# Patient Record
Sex: Female | Born: 1955
Health system: Southern US, Community
[De-identification: ages and names within clinical notes are randomized; demographics above are authoritative.]

## PROBLEM LIST (undated history)

## (undated) DIAGNOSIS — M1990 Unspecified osteoarthritis, unspecified site: Secondary | ICD-10-CM

## (undated) DIAGNOSIS — I10 Essential (primary) hypertension: Secondary | ICD-10-CM

## (undated) DIAGNOSIS — E785 Hyperlipidemia, unspecified: Secondary | ICD-10-CM

## (undated) HISTORY — PX: COLONOSCOPY: SHX174

---

## 2004-04-15 ENCOUNTER — Ambulatory Visit: Payer: Self-pay | Admitting: Unknown Physician Specialty

## 2005-12-14 ENCOUNTER — Ambulatory Visit: Payer: Self-pay | Admitting: Internal Medicine

## 2007-06-20 ENCOUNTER — Ambulatory Visit: Payer: Self-pay | Admitting: Family Medicine

## 2008-06-22 ENCOUNTER — Ambulatory Visit: Payer: Self-pay | Admitting: Internal Medicine

## 2008-11-15 ENCOUNTER — Ambulatory Visit: Payer: Self-pay

## 2009-06-24 ENCOUNTER — Ambulatory Visit: Payer: Self-pay | Admitting: Internal Medicine

## 2010-11-13 ENCOUNTER — Emergency Department: Payer: Self-pay | Admitting: Emergency Medicine

## 2011-09-06 ENCOUNTER — Ambulatory Visit: Payer: Self-pay | Admitting: Internal Medicine

## 2011-11-21 ENCOUNTER — Ambulatory Visit: Payer: Self-pay | Admitting: Unknown Physician Specialty

## 2011-11-22 LAB — PATHOLOGY REPORT

## 2012-07-04 ENCOUNTER — Ambulatory Visit: Payer: Self-pay | Admitting: Internal Medicine

## 2012-07-04 LAB — RAPID STREP-A WITH REFLX: Micro Text Report: NEGATIVE

## 2012-09-06 ENCOUNTER — Ambulatory Visit: Payer: Self-pay | Admitting: Internal Medicine

## 2013-09-09 ENCOUNTER — Ambulatory Visit: Payer: Self-pay | Admitting: Internal Medicine

## 2013-12-10 DIAGNOSIS — I10 Essential (primary) hypertension: Secondary | ICD-10-CM | POA: Insufficient documentation

## 2013-12-10 DIAGNOSIS — J45909 Unspecified asthma, uncomplicated: Secondary | ICD-10-CM | POA: Insufficient documentation

## 2013-12-10 DIAGNOSIS — N951 Menopausal and female climacteric states: Secondary | ICD-10-CM | POA: Insufficient documentation

## 2014-12-17 DIAGNOSIS — R42 Dizziness and giddiness: Secondary | ICD-10-CM | POA: Insufficient documentation

## 2014-12-24 DIAGNOSIS — Z86010 Personal history of colonic polyps: Secondary | ICD-10-CM | POA: Insufficient documentation

## 2015-08-06 ENCOUNTER — Other Ambulatory Visit: Payer: Self-pay | Admitting: Internal Medicine

## 2015-08-06 DIAGNOSIS — Z1231 Encounter for screening mammogram for malignant neoplasm of breast: Secondary | ICD-10-CM

## 2016-04-13 DIAGNOSIS — J4521 Mild intermittent asthma with (acute) exacerbation: Secondary | ICD-10-CM | POA: Diagnosis not present

## 2016-04-13 DIAGNOSIS — J40 Bronchitis, not specified as acute or chronic: Secondary | ICD-10-CM | POA: Diagnosis not present

## 2016-04-13 DIAGNOSIS — I10 Essential (primary) hypertension: Secondary | ICD-10-CM | POA: Diagnosis not present

## 2016-05-26 DIAGNOSIS — M7711 Lateral epicondylitis, right elbow: Secondary | ICD-10-CM | POA: Diagnosis not present

## 2016-05-26 DIAGNOSIS — I10 Essential (primary) hypertension: Secondary | ICD-10-CM | POA: Diagnosis not present

## 2016-06-22 DIAGNOSIS — E782 Mixed hyperlipidemia: Secondary | ICD-10-CM | POA: Diagnosis not present

## 2016-06-22 DIAGNOSIS — Z1159 Encounter for screening for other viral diseases: Secondary | ICD-10-CM | POA: Diagnosis not present

## 2016-08-14 ENCOUNTER — Ambulatory Visit
Admission: EM | Admit: 2016-08-14 | Discharge: 2016-08-14 | Disposition: A | Discharge status: Home / Self Care | Payer: 59 | Attending: Family Medicine | Admitting: Family Medicine

## 2016-08-14 DIAGNOSIS — S83411A Sprain of medial collateral ligament of right knee, initial encounter: Secondary | ICD-10-CM

## 2016-08-14 DIAGNOSIS — S83419A Sprain of medial collateral ligament of unspecified knee, initial encounter: Secondary | ICD-10-CM | POA: Diagnosis not present

## 2016-08-14 HISTORY — DX: Essential (primary) hypertension: I10

## 2016-08-14 HISTORY — DX: Hyperlipidemia, unspecified: E78.5

## 2016-08-14 MED ORDER — HYDROCODONE-ACETAMINOPHEN 5-325 MG PO TABS: ORAL_TABLET | ORAL | 0 refills | Status: DC

## 2016-08-14 NOTE — ED Triage Notes (Signed)
Patient complains of right knee pain. Patient states that this started around 4 days ago. Patient states that pain is toward the left side of her knee. Patient states that she just returned from [place] yesterday and did a lot of walking while she was there. Patient states that she has been icing knee. Patient reports that she has not noticed any swelling.

## 2016-08-14 NOTE — ED Provider Notes (Signed)
[facility]    CSN: [id number] Arrival date & time: 08/14/16  1031     History   Chief Complaint Chief Complaint  Patient presents with  . Knee Pain    Right    HPI Alejandra Castro is a 61 y.o. female.   The history is provided by the patient.  Knee Pain  Location:  Knee Time since incident:  4 days Injury: no   Knee location:  R knee Pain details:    Quality:  Aching Chronicity:  New Dislocation: no   Foreign body present:  No foreign bodies Prior injury to area:  No Ineffective treatments:  NSAIDs (400mg  of ibuprofen) Associated symptoms: no back pain, no decreased ROM, no fatigue, no fever, no itching, no muscle weakness, no neck pain, no numbness, no stiffness, no swelling and no tingling   Risk factors: no frequent fractures, no known bone disorder and no recent illness     Past Medical History:  Diagnosis Date  . Hyperlipidemia   . Hypertension     There are no active problems to display for this patient.   Past Surgical History:  Procedure Laterality Date  . NO PAST SURGERIES      OB History    No data available       Home Medications    Prior to Admission medications   Medication Sig Start Date End Date Taking? Authorizing Provider  aspirin EC 81 MG tablet Take 81 mg by mouth daily.   Yes [provider]  felodipine (PLENDIL) 5 MG 24 hr tablet Take 5 mg by mouth daily.   Yes [provider]  FLUoxetine (PROZAC) 20 MG tablet Take 20 mg by mouth daily.   Yes [provider]  metoprolol succinate (TOPROL-XL) 50 MG 24 hr tablet Take 50 mg by mouth daily. Take with or immediately following a meal.   Yes [provider]  simvastatin (ZOCOR) 20 MG tablet Take 20 mg by mouth daily.   Yes [provider]  HYDROcodone-acetaminophen (NORCO/VICODIN) 5-325 MG tablet 1-2 tabs po bid prn 08/14/16   Norval Gable, MD    Family History History reviewed. No pertinent family history.  Social  History Social History  Substance Use Topics  . Smoking status: Never Smoker  . Smokeless tobacco: Never Used  . Alcohol use Yes     Comment: beer occasionally     Allergies   Patient has no known allergies.   Review of Systems Review of Systems  Constitutional: Negative for fatigue and fever.  Musculoskeletal: Negative for back pain, neck pain and stiffness.  Skin: Negative for itching.     Physical Exam Triage Vital Signs ED Triage Vitals  Enc Vitals Group     BP 08/14/16 1131 (!) 150/91     Pulse Rate 08/14/16 1131 63     Resp 08/14/16 1131 18     Temp 08/14/16 1131 98.7 F (37.1 C)     Temp Source 08/14/16 1131 Oral     SpO2 08/14/16 1131 96 %     Weight 08/14/16 1128 200 lb (90.7 kg)     Height 08/14/16 1128 5\' 6"  (1.676 m)     Head Circumference --      Peak Flow --      Pain Score 08/14/16 1128 10     Pain Loc --      Pain Edu? --      Excl. in [place]? --    No data found.  Updated Vital Signs BP (!) 150/91 (BP Location: Left Arm)   Pulse 63   Temp 98.7 F (37.1 C) (Oral)   Resp 18   Ht 5\' 6"  (1.676 m)   Wt 200 lb (90.7 kg)   SpO2 96%   BMI 32.28 kg/m   Visual Acuity Right Eye Distance:   Left Eye Distance:   Bilateral Distance:    Right Eye Near:   Left Eye Near:    Bilateral Near:     Physical Exam  Constitutional: She appears well-developed and well-nourished. No distress.  Musculoskeletal:       Right knee: She exhibits swelling (mild/minimal). She exhibits normal range of motion, no effusion, no ecchymosis, no deformity, no laceration, no erythema, normal alignment, no LCL laxity, normal patellar mobility, no bony tenderness, normal meniscus and no MCL laxity. Tenderness found. MCL tenderness noted. No medial joint line, no lateral joint line, no LCL and no patellar tendon tenderness noted.  Skin: She is not diaphoretic.  Nursing note and vitals reviewed.    UC Treatments / Results  Labs (all labs ordered are listed, but only  abnormal results are displayed) Labs Reviewed - No data to display  EKG  EKG Interpretation None       Radiology No results found.  Procedures Procedures (including critical care time)  Medications Ordered in UC Medications - No data to display   Initial Impression / Assessment and Plan / UC Course  I have reviewed the triage vital signs and the nursing notes.  Pertinent labs & imaging results that were available during my care of the patient were reviewed by me and considered in my medical decision making (see chart for details).       Final Clinical Impressions(s) / UC Diagnoses   Final diagnoses:  Sprain of medial collateral ligament of right knee, initial encounter    New Prescriptions Discharge Medication List as of 08/14/2016  1:10 PM    START taking these medications   Details  HYDROcodone-acetaminophen (NORCO/VICODIN) 5-325 MG tablet 1-2 tabs po bid prn, Print       1. diagnosis reviewed with patient 2. rx as per orders above; reviewed possible side effects, interactions, risks and benefits  3. Recommend supportive treatment with rest, ice, compression, elevation 4. Follow-up prn if symptoms worsen or don't improve   Norval Gable, MD 08/14/16 1425

## 2016-08-21 DIAGNOSIS — M25561 Pain in right knee: Secondary | ICD-10-CM | POA: Diagnosis not present

## 2016-09-06 DIAGNOSIS — M25561 Pain in right knee: Secondary | ICD-10-CM | POA: Diagnosis not present

## 2016-09-07 ENCOUNTER — Other Ambulatory Visit: Payer: Self-pay | Admitting: Unknown Physician Specialty

## 2016-09-07 DIAGNOSIS — M25561 Pain in right knee: Secondary | ICD-10-CM

## 2016-09-19 ENCOUNTER — Ambulatory Visit
Admission: RE | Admit: 2016-09-19 | Discharge: 2016-09-19 | Disposition: A | Discharge status: Home / Self Care | Payer: 59 | Source: Ambulatory Visit | Attending: Unknown Physician Specialty | Admitting: Unknown Physician Specialty

## 2016-09-19 DIAGNOSIS — M25561 Pain in right knee: Secondary | ICD-10-CM | POA: Diagnosis not present

## 2016-09-19 DIAGNOSIS — M25461 Effusion, right knee: Secondary | ICD-10-CM | POA: Diagnosis not present

## 2016-09-19 DIAGNOSIS — S83231A Complex tear of medial meniscus, current injury, right knee, initial encounter: Secondary | ICD-10-CM | POA: Insufficient documentation

## 2016-09-19 DIAGNOSIS — M949 Disorder of cartilage, unspecified: Secondary | ICD-10-CM | POA: Insufficient documentation

## 2016-09-19 DIAGNOSIS — X58XXXA Exposure to other specified factors, initial encounter: Secondary | ICD-10-CM | POA: Diagnosis not present

## 2016-09-27 DIAGNOSIS — S83221D Peripheral tear of medial meniscus, current injury, right knee, subsequent encounter: Secondary | ICD-10-CM | POA: Diagnosis not present

## 2016-10-02 ENCOUNTER — Encounter: Payer: Self-pay | Admitting: Emergency Medicine

## 2016-10-02 ENCOUNTER — Emergency Department: Payer: 59

## 2016-10-02 ENCOUNTER — Emergency Department
Admission: EM | Admit: 2016-10-02 | Discharge: 2016-10-02 | Disposition: A | Discharge status: Home / Self Care | Payer: 59 | Attending: Emergency Medicine | Admitting: Emergency Medicine

## 2016-10-02 DIAGNOSIS — Y92099 Unspecified place in other non-institutional residence as the place of occurrence of the external cause: Secondary | ICD-10-CM | POA: Insufficient documentation

## 2016-10-02 DIAGNOSIS — W19XXXA Unspecified fall, initial encounter: Secondary | ICD-10-CM

## 2016-10-02 DIAGNOSIS — Y998 Other external cause status: Secondary | ICD-10-CM | POA: Diagnosis not present

## 2016-10-02 DIAGNOSIS — S42352A Displaced comminuted fracture of shaft of humerus, left arm, initial encounter for closed fracture: Secondary | ICD-10-CM | POA: Diagnosis not present

## 2016-10-02 DIAGNOSIS — W01198A Fall on same level from slipping, tripping and stumbling with subsequent striking against other object, initial encounter: Secondary | ICD-10-CM | POA: Diagnosis not present

## 2016-10-02 DIAGNOSIS — I10 Essential (primary) hypertension: Secondary | ICD-10-CM | POA: Diagnosis not present

## 2016-10-02 DIAGNOSIS — Y939 Activity, unspecified: Secondary | ICD-10-CM | POA: Diagnosis not present

## 2016-10-02 DIAGNOSIS — S4992XA Unspecified injury of left shoulder and upper arm, initial encounter: Secondary | ICD-10-CM | POA: Diagnosis not present

## 2016-10-02 DIAGNOSIS — S42202A Unspecified fracture of upper end of left humerus, initial encounter for closed fracture: Secondary | ICD-10-CM | POA: Diagnosis not present

## 2016-10-02 MED ORDER — ONDANSETRON HCL 4 MG/2ML IJ SOLN
4.0000 mg | Freq: Once | INTRAMUSCULAR | Status: AC
  Administered 2016-10-02: 4 mg via INTRAVENOUS
  Filled 2016-10-02: qty 2

## 2016-10-02 MED ORDER — HYDROMORPHONE HCL 1 MG/ML IJ SOLN
1.0000 mg | Freq: Once | INTRAMUSCULAR | Status: AC
  Administered 2016-10-02: 1 mg via INTRAVENOUS
  Filled 2016-10-02: qty 1

## 2016-10-02 MED ORDER — MORPHINE SULFATE (PF) 4 MG/ML IV SOLN
INTRAVENOUS | Status: AC
  Administered 2016-10-02: 4 mg
  Filled 2016-10-02: qty 1

## 2016-10-02 MED ORDER — SODIUM CHLORIDE 0.9 % IV BOLUS (SEPSIS)
1000.0000 mL | Freq: Once | INTRAVENOUS | Status: AC
  Administered 2016-10-02: 1000 mL via INTRAVENOUS

## 2016-10-02 MED ORDER — ONDANSETRON HCL 4 MG/2ML IJ SOLN
4.0000 mg | Freq: Once | INTRAMUSCULAR | Status: AC
  Administered 2016-10-02: 4 mg via INTRAVENOUS

## 2016-10-02 MED ORDER — FENTANYL CITRATE (PF) 100 MCG/2ML IJ SOLN
50.0000 ug | INTRAMUSCULAR | Status: DC | PRN
  Administered 2016-10-02: 50 ug via INTRAVENOUS

## 2016-10-02 MED ORDER — FENTANYL CITRATE (PF) 100 MCG/2ML IJ SOLN
INTRAMUSCULAR | Status: AC
  Administered 2016-10-02: 50 ug via INTRAVENOUS
  Filled 2016-10-02: qty 2

## 2016-10-02 MED ORDER — IBUPROFEN 800 MG PO TABS: 800.0000 mg | ORAL_TABLET | Freq: Three times a day (TID) | ORAL | 0 refills | Status: DC | PRN

## 2016-10-02 MED ORDER — OXYCODONE-ACETAMINOPHEN 5-325 MG PO TABS: 1.0000 | ORAL_TABLET | ORAL | 0 refills | Status: AC | PRN

## 2016-10-02 MED ORDER — ONDANSETRON HCL 4 MG/2ML IJ SOLN
INTRAMUSCULAR | Status: AC
  Administered 2016-10-02: 4 mg via INTRAVENOUS
  Filled 2016-10-02: qty 2

## 2016-10-02 NOTE — ED Triage Notes (Signed)
Tripped and fell over dog just prior to arrival, severe pain L upper arm just above elbow. Patient hyperventilating and dizzy on arrival to triage. Able to slow breathing with resolution of symptoms.

## 2016-10-02 NOTE — ED Notes (Signed)
Splint applied to left upper arm.  Pt tolerated well.

## 2016-10-02 NOTE — ED Notes (Signed)
Iv meds given.  

## 2016-10-02 NOTE — ED Notes (Signed)
Resumed care from [facility].  Pt has left arm pain.  Iv in place  Dr Mariea Clonts at bedside.

## 2016-10-02 NOTE — ED Provider Notes (Signed)
[facility] Provider Note  ____________________________________________  Time seen: Approximately 3:12 PM  I have reviewed the triage vital signs and the nursing notes.   HISTORY  Chief Complaint Arm Pain    HPI Alejandra Castro is a 61 y.o. female , right-handed, presenting with left upper arm pain after fall. The patient reports that prior to arrival she tripped over her dog and fell into a doorway frame, striking her left arm and falling down. She did not hit her head or lose consciousness. She was able to stand up and ambulate. She denies any neck, back or any other extremity pain. She denies any numbness or tingling. She reports that she did not initially have significant pain, until she was laid flat for x-ray and was unable to tolerate any imaging. She then developed severe pain with associated nausea, the nausea has improved with Zofran. No headache or vomiting.    Past Medical History:  Diagnosis Date  . Hyperlipidemia   . Hypertension     There are no active problems to display for this patient.   Past Surgical History:  Procedure Laterality Date  . NO PAST SURGERIES      Current Outpatient Rx  . Order #: [id number] Class: Historical Med  . Order #: [id number] Class: Historical Med  . Order #: [id number] Class: Historical Med  . Order #: [id number] Class: Print  . Order #: [id number] Class: Historical Med  . Order #: [id number] Class: Historical Med    Allergies Patient has no known allergies.  No family history on file.  Social History Social History  Substance Use Topics  . Smoking status: Never Smoker  . Smokeless tobacco: Never Used  . Alcohol use Yes     Comment: beer occasionally    Review of Systems Constitutional: No fever/chills.No lightheadedness or syncope. Positive fall. No loss of consciousness. Eyes: No visual changes. No blurred or double vision. ENT: No congestion or rhinorrhea. Cardiovascular:  Denies chest pain. Denies palpitations. Respiratory: Denies shortness of breath.  No cough. Gastrointestinal: No abdominal pain.  Positive nausea, no vomiting.  No diarrhea.  No constipation. Genitourinary: Negative for dysuria. Musculoskeletal: Negative for neck or back pain. Able to ambulate without pain in the legs. Positive pain in the mid section of the left upper arm. Skin: Negative for rash. Neurological: Negative for headaches. No focal numbness, tingling or weakness.     ____________________________________________   PHYSICAL EXAM:  VITAL SIGNS: ED Triage Vitals  Enc Vitals Group     BP 10/02/16 1409 98/68     Pulse Rate 10/02/16 1409 65     Resp 10/02/16 1409 18     Temp 10/02/16 1409 98 F (36.7 C)     Temp Source 10/02/16 1409 Oral     SpO2 10/02/16 1409 99 %     Weight 10/02/16 1410 210 lb (95.3 kg)     Height 10/02/16 1410 5\' 6"  (1.676 m)     Head Circumference --      Peak Flow --      Pain Score 10/02/16 1409 10     Pain Loc --      Pain Edu? --      Excl. in [place]? --     Constitutional: Alert and oriented. Mildly uncomfortable appearing but in no acute distress. Answers questions appropriately. GCS is 15. Eyes: Conjunctivae are normal.  EOMI. No scleral icterus. No raccoon eyes. Head: Atraumatic. No Battle sign. Nose: No congestion/rhinnorhea. No swelling over the nose. No septal hematoma. Mouth/Throat:  Mucous membranes are moist. No malocclusion or dental injury. Neck: No stridor.  Supple.  No midline C-spine tenderness to palpation, step-offs or deformities. Full range of motion without pain. Cardiovascular: Normal rate, regular rhythm. No murmurs, rubs or gallops.  Respiratory: Normal respiratory effort.  No accessory muscle use or retractions. Lungs CTAB.  No wheezes, rales or ronchi. Gastrointestinal: Soft, nontender and nondistended.  No guarding or rebound.  No peritoneal signs. Musculoskeletal: Pelvis is stable. Full range of motion of the bilateral  ankles, knees, hips, wrists, right elbow and right shoulder without pain. The patient has significant with palpation over the proximal third of the humerus and severe pain with any movement of the elbow or shoulder on the left side. She has normal left radial pulse. She has 5 out of 5 grip strength on the left. She has normal sensation to light touch in the left upper extremity. Her skin examination is normal and she has no abrasions or lacerations, ecchymosis in the left upper extremity. Neurologic:  A&Ox3.  Speech is clear.  Face and smile are symmetric.  EOMI.  Moves all extremities well. Skin:  Skin is warm, dry and intact. No rash noted. Psychiatric: Mood and affect are normal. Speech and behavior are normal.  Normal judgement.  ____________________________________________   LABS (all labs ordered are listed, but only abnormal results are displayed)  Labs Reviewed - No data to display ____________________________________________  EKG  Not indicated ____________________________________________  RADIOLOGY  Dg Humerus Left  Result Date: 10/02/2016 CLINICAL DATA:  61 year old who tripped over her dog and fell, landing on her left arm. Severe pain. Initial encounter. EXAM: LEFT HUMERUS - 2+ VIEW COMPARISON:  None. FINDINGS: Acute comminuted 3 part fracture involving the proximal humeral metaphysis and diaphysis. Visualized shoulder joint and elbow joint anatomically aligned and intact. Bone mineral density well preserved with normal humeral cortical thickness. IMPRESSION: Acute comminuted 3 part fracture involving the proximal humerus. (As bone mineral density appears well-preserved, an underlying pathologic process may account for the fracture, given the fact that the fall was from a standing height). Electronically Signed   By: Evangeline Dakin M.D.   On: 10/02/2016 15:44    ____________________________________________   PROCEDURES  Procedure(s) performed:  None  Procedures  Critical Care performed: No ____________________________________________   INITIAL IMPRESSION / ASSESSMENT AND PLAN / ED COURSE  Pertinent labs & imaging results that were available during my care of the patient were reviewed by me and considered in my medical decision making (see chart for details).  61 y.o. right-handed female presenting with left upper arm pain after fall. The patient did not lose consciousness and has no evidence that would be consistent with an acute head injury, or any spine injury. There are no other obvious extremity injuries. We'll get a shoulder x-ray, and continue to manage the patient's pain.  Plan reevaluation after imaging.  ----------------------------------------- 4:04 PM on 10/02/2016 -----------------------------------------  The patient does have an acute comminuted 3 part fracture in the proximal humeral metaphysis and diaphysis. I have spoken with Dr. Roland Rack, the orthopedist on-call, who recommends a coaptation splint. Depending on how the bone begins to heal, the patient will be reevaluated in 1 week in his clinic with repeat imaging. Surgical versus nonsurgical treatment will be decided based on the repeat imaging. The patient understands the plan, will place her in a splint and ensure that she has plenty of pain control for home, and I anticipate discharge. She return precautions as well as follow-up instructions were given.  SPLINT APPLICATION Date/Time: 3:22 PM Authorized by: Eula Listen Consent: Verbal consent obtained. Risks and benefits: risks, benefits and alternatives were discussed Consent given by: patient Splint applied by: ER technician Location details: left upper arm Splint type: coaptation splint   Supplies used: Orthoglass Post-procedure: The splinted body part was neurovascularly unchanged following the procedure. Patient tolerance: Patient tolerated the procedure well with no immediate complications.  I re-evaluated the splint and the pt was neurovascularly intact.     ____________________________________________  FINAL CLINICAL IMPRESSION(S) / ED DIAGNOSES  Final diagnoses:  Closed displaced comminuted fracture of shaft of left humerus, initial encounter  Fall, initial encounter         NEW MEDICATIONS STARTED DURING THIS VISIT:  New Prescriptions   No medications on file      Eula Listen, MD 10/02/16 1609

## 2016-10-02 NOTE — ED Notes (Signed)
D/c inst to pt and son.

## 2016-10-02 NOTE — Discharge Instructions (Signed)
You may take Tylenol or Motrin for mild to moderate pain. Take Percocet for severe pain. Do not drive until you're cleared by the orthopedist to do so, as she will be unable to hold the steering well. Keep your splint on at all times, until the orthopedist says that you may remove it.  You will be seen by [place], Dr. Nicholaus Bloom PA, on Monday, July 2 at 10:30 AM. They will repeat an x-ray, and determine whether you can continue conservative nonsurgical management or whether you will require surgery.  Return to the emergency department if you develop severe pain, numbness tingling or weakness, changes in your skin color, fever, or any other symptoms concerning to you.

## 2016-10-09 DIAGNOSIS — S42352A Displaced comminuted fracture of shaft of humerus, left arm, initial encounter for closed fracture: Secondary | ICD-10-CM | POA: Diagnosis not present

## 2016-10-12 DIAGNOSIS — S42302B Unspecified fracture of shaft of humerus, left arm, initial encounter for open fracture: Secondary | ICD-10-CM | POA: Diagnosis not present

## 2016-10-13 ENCOUNTER — Encounter
Admission: RE | Admit: 2016-10-13 | Discharge: 2016-10-13 | Disposition: A | Discharge status: Home / Self Care | Payer: 59 | Source: Ambulatory Visit | Attending: Surgery | Admitting: Surgery

## 2016-10-13 HISTORY — DX: Unspecified osteoarthritis, unspecified site: M19.90

## 2016-10-13 NOTE — Patient Instructions (Addendum)
  Your procedure is scheduled on: 10-17-16 TUESDAY Report to Same Day Surgery 2nd floor medical mall [facility] Entrance-take elevator on left to 2nd floor.  Check in with surgery information desk.) To find out your arrival time please call [phone] between 1PM - 3PM on 10-16-16  Remember: Instructions that are not followed completely may result in serious medical risk, up to and including death, or upon the discretion of your surgeon and anesthesiologist your surgery may need to be rescheduled.    _x___ 1. Do not eat food or drink liquids after midnight. No gum chewing or hard candies.     __x__ 2. No Alcohol for 24 hours before or after surgery.   __x__3. No Smoking for 24 prior to surgery.   ____  4. Bring all medications with you on the day of surgery if instructed.    __x__ 5. Notify your doctor if there is any change in your medical condition     (cold, fever, infections).     Do not wear jewelry, make-up, hairpins, clips or nail polish.  Do not wear lotions, powders, or perfumes. You may wear deodorant.  Do not shave 48 hours prior to surgery. Men may shave face and neck.  Do not bring valuables to the hospital.    [facility] is not responsible for any belongings or valuables.               Contacts, dentures or bridgework may not be worn into surgery.  Leave your suitcase in the car. After surgery it may be brought to your room.  For patients admitted to the hospital, discharge time is determined by your treatment team.   Patients discharged the day of surgery will not be allowed to drive home.  You will need someone to drive you home and stay with you the night of your procedure.    Please read over the following fact sheets that you were given:   [facility] Preparing for Surgery and or MRSA Information   _x___ TAKE THE FOLLOWING MEDICATIONS THE MORNING OF SURGERY WITH A SMALL SIP OF WATER. These include:  1. PROZAC (FLUOXETINE)  2.  3.  4.  5.  6.  ____Fleets  enema or Magnesium Citrate as directed.   _x___ Use CHG Soap or sage wipes as directed on instruction sheet   ____ Use inhalers on the day of surgery and bring to hospital day of surgery  ____ Stop Metformin and Janumet 2 days prior to surgery.    ____ Take 1/2 of usual insulin dose the night before surgery and none on the morning  surgery.   _x___ Follow recommendations from Cardiologist, Pulmonologist or PCP regarding stopping Aspirin, Coumadin, Pllavix ,Eliquis, Effient, or Pradaxa, and Pletal-STOP 81 MG ASPIRIN NOW  X____Stop Anti-inflammatories such as Advil, Aleve, Ibuprofen, Motrin, Naproxen, Naprosyn, Goodies powders or aspirin products. OK to take Tylenol AND OXYCODONE   _x___ Stop supplements until after surgery-STOP FISH OIL NOW-MAY RESUME AFTER SURGERY   ____ Bring C-Pap to the hospital.

## 2016-10-16 ENCOUNTER — Inpatient Hospital Stay: Admission: RE | Admit: 2016-10-16 | Payer: 59 | Source: Ambulatory Visit

## 2016-10-16 ENCOUNTER — Encounter: Payer: Self-pay | Admitting: Anesthesiology

## 2016-10-16 DIAGNOSIS — S42352A Displaced comminuted fracture of shaft of humerus, left arm, initial encounter for closed fracture: Secondary | ICD-10-CM | POA: Diagnosis not present

## 2016-10-16 DIAGNOSIS — S42352D Displaced comminuted fracture of shaft of humerus, left arm, subsequent encounter for fracture with routine healing: Secondary | ICD-10-CM | POA: Diagnosis not present

## 2016-10-17 ENCOUNTER — Ambulatory Visit: Admission: RE | Admit: 2016-10-17 | Payer: 59 | Source: Ambulatory Visit | Admitting: Surgery

## 2016-10-17 ENCOUNTER — Encounter: Admission: RE | Payer: Self-pay | Source: Ambulatory Visit

## 2016-10-17 SURGERY — OPEN REDUCTION INTERNAL FIXATION (ORIF) HUMERAL SHAFT FRACTURE
Anesthesia: Choice | Laterality: Left

## 2016-11-14 DIAGNOSIS — S42352D Displaced comminuted fracture of shaft of humerus, left arm, subsequent encounter for fracture with routine healing: Secondary | ICD-10-CM | POA: Diagnosis not present

## 2016-12-13 DIAGNOSIS — S42352D Displaced comminuted fracture of shaft of humerus, left arm, subsequent encounter for fracture with routine healing: Secondary | ICD-10-CM | POA: Diagnosis not present

## 2016-12-13 DIAGNOSIS — M25512 Pain in left shoulder: Secondary | ICD-10-CM | POA: Diagnosis not present

## 2016-12-18 DIAGNOSIS — M7751 Other enthesopathy of right foot: Secondary | ICD-10-CM | POA: Diagnosis not present

## 2016-12-18 DIAGNOSIS — M79671 Pain in right foot: Secondary | ICD-10-CM | POA: Diagnosis not present

## 2016-12-20 DIAGNOSIS — M25512 Pain in left shoulder: Secondary | ICD-10-CM | POA: Diagnosis not present

## 2016-12-20 DIAGNOSIS — S42352D Displaced comminuted fracture of shaft of humerus, left arm, subsequent encounter for fracture with routine healing: Secondary | ICD-10-CM | POA: Diagnosis not present

## 2016-12-20 DIAGNOSIS — M25522 Pain in left elbow: Secondary | ICD-10-CM | POA: Diagnosis not present

## 2016-12-25 DIAGNOSIS — S42352D Displaced comminuted fracture of shaft of humerus, left arm, subsequent encounter for fracture with routine healing: Secondary | ICD-10-CM | POA: Diagnosis not present

## 2016-12-25 DIAGNOSIS — M25512 Pain in left shoulder: Secondary | ICD-10-CM | POA: Diagnosis not present

## 2016-12-25 DIAGNOSIS — M25522 Pain in left elbow: Secondary | ICD-10-CM | POA: Diagnosis not present

## 2016-12-26 ENCOUNTER — Other Ambulatory Visit: Payer: Self-pay | Admitting: Internal Medicine

## 2016-12-26 DIAGNOSIS — E782 Mixed hyperlipidemia: Secondary | ICD-10-CM | POA: Diagnosis not present

## 2016-12-26 DIAGNOSIS — Z1231 Encounter for screening mammogram for malignant neoplasm of breast: Secondary | ICD-10-CM

## 2016-12-26 DIAGNOSIS — I10 Essential (primary) hypertension: Secondary | ICD-10-CM | POA: Diagnosis not present

## 2016-12-26 DIAGNOSIS — Z23 Encounter for immunization: Secondary | ICD-10-CM | POA: Diagnosis not present

## 2016-12-26 DIAGNOSIS — R809 Proteinuria, unspecified: Secondary | ICD-10-CM | POA: Diagnosis not present

## 2016-12-27 DIAGNOSIS — M25512 Pain in left shoulder: Secondary | ICD-10-CM | POA: Diagnosis not present

## 2016-12-27 DIAGNOSIS — M25522 Pain in left elbow: Secondary | ICD-10-CM | POA: Diagnosis not present

## 2016-12-27 DIAGNOSIS — S42352D Displaced comminuted fracture of shaft of humerus, left arm, subsequent encounter for fracture with routine healing: Secondary | ICD-10-CM | POA: Diagnosis not present

## 2016-12-29 DIAGNOSIS — M25512 Pain in left shoulder: Secondary | ICD-10-CM | POA: Diagnosis not present

## 2016-12-29 DIAGNOSIS — S42352D Displaced comminuted fracture of shaft of humerus, left arm, subsequent encounter for fracture with routine healing: Secondary | ICD-10-CM | POA: Diagnosis not present

## 2016-12-29 DIAGNOSIS — M25522 Pain in left elbow: Secondary | ICD-10-CM | POA: Diagnosis not present

## 2017-01-01 DIAGNOSIS — M25522 Pain in left elbow: Secondary | ICD-10-CM | POA: Diagnosis not present

## 2017-01-01 DIAGNOSIS — S42352D Displaced comminuted fracture of shaft of humerus, left arm, subsequent encounter for fracture with routine healing: Secondary | ICD-10-CM | POA: Diagnosis not present

## 2017-01-01 DIAGNOSIS — M25512 Pain in left shoulder: Secondary | ICD-10-CM | POA: Diagnosis not present

## 2017-01-03 DIAGNOSIS — S42352D Displaced comminuted fracture of shaft of humerus, left arm, subsequent encounter for fracture with routine healing: Secondary | ICD-10-CM | POA: Diagnosis not present

## 2017-01-03 DIAGNOSIS — M25522 Pain in left elbow: Secondary | ICD-10-CM | POA: Diagnosis not present

## 2017-01-03 DIAGNOSIS — M25512 Pain in left shoulder: Secondary | ICD-10-CM | POA: Diagnosis not present

## 2017-01-05 DIAGNOSIS — M25512 Pain in left shoulder: Secondary | ICD-10-CM | POA: Diagnosis not present

## 2017-01-05 DIAGNOSIS — M25522 Pain in left elbow: Secondary | ICD-10-CM | POA: Diagnosis not present

## 2017-01-05 DIAGNOSIS — S42352D Displaced comminuted fracture of shaft of humerus, left arm, subsequent encounter for fracture with routine healing: Secondary | ICD-10-CM | POA: Diagnosis not present

## 2017-01-08 DIAGNOSIS — S42352D Displaced comminuted fracture of shaft of humerus, left arm, subsequent encounter for fracture with routine healing: Secondary | ICD-10-CM | POA: Diagnosis not present

## 2017-01-08 DIAGNOSIS — S42355G Nondisplaced comminuted fracture of shaft of humerus, left arm, subsequent encounter for fracture with delayed healing: Secondary | ICD-10-CM | POA: Diagnosis not present

## 2017-01-10 DIAGNOSIS — M25522 Pain in left elbow: Secondary | ICD-10-CM | POA: Diagnosis not present

## 2017-01-10 DIAGNOSIS — S42352D Displaced comminuted fracture of shaft of humerus, left arm, subsequent encounter for fracture with routine healing: Secondary | ICD-10-CM | POA: Diagnosis not present

## 2017-01-10 DIAGNOSIS — M25512 Pain in left shoulder: Secondary | ICD-10-CM | POA: Diagnosis not present

## 2017-01-12 DIAGNOSIS — M25522 Pain in left elbow: Secondary | ICD-10-CM | POA: Diagnosis not present

## 2017-01-12 DIAGNOSIS — M25512 Pain in left shoulder: Secondary | ICD-10-CM | POA: Diagnosis not present

## 2017-01-12 DIAGNOSIS — S42352D Displaced comminuted fracture of shaft of humerus, left arm, subsequent encounter for fracture with routine healing: Secondary | ICD-10-CM | POA: Diagnosis not present

## 2017-01-18 DIAGNOSIS — S42355G Nondisplaced comminuted fracture of shaft of humerus, left arm, subsequent encounter for fracture with delayed healing: Secondary | ICD-10-CM | POA: Diagnosis not present

## 2017-01-19 DIAGNOSIS — S42352D Displaced comminuted fracture of shaft of humerus, left arm, subsequent encounter for fracture with routine healing: Secondary | ICD-10-CM | POA: Diagnosis not present

## 2017-01-19 DIAGNOSIS — M25522 Pain in left elbow: Secondary | ICD-10-CM | POA: Diagnosis not present

## 2017-01-19 DIAGNOSIS — M25512 Pain in left shoulder: Secondary | ICD-10-CM | POA: Diagnosis not present

## 2017-01-22 DIAGNOSIS — M25522 Pain in left elbow: Secondary | ICD-10-CM | POA: Diagnosis not present

## 2017-01-22 DIAGNOSIS — M25512 Pain in left shoulder: Secondary | ICD-10-CM | POA: Diagnosis not present

## 2017-01-22 DIAGNOSIS — S42352D Displaced comminuted fracture of shaft of humerus, left arm, subsequent encounter for fracture with routine healing: Secondary | ICD-10-CM | POA: Diagnosis not present

## 2017-01-24 DIAGNOSIS — S42352D Displaced comminuted fracture of shaft of humerus, left arm, subsequent encounter for fracture with routine healing: Secondary | ICD-10-CM | POA: Diagnosis not present

## 2017-01-24 DIAGNOSIS — M25522 Pain in left elbow: Secondary | ICD-10-CM | POA: Diagnosis not present

## 2017-01-24 DIAGNOSIS — M25512 Pain in left shoulder: Secondary | ICD-10-CM | POA: Diagnosis not present

## 2017-01-29 DIAGNOSIS — M25512 Pain in left shoulder: Secondary | ICD-10-CM | POA: Diagnosis not present

## 2017-01-29 DIAGNOSIS — S42352D Displaced comminuted fracture of shaft of humerus, left arm, subsequent encounter for fracture with routine healing: Secondary | ICD-10-CM | POA: Diagnosis not present

## 2017-01-29 DIAGNOSIS — M25522 Pain in left elbow: Secondary | ICD-10-CM | POA: Diagnosis not present

## 2017-02-02 DIAGNOSIS — M25522 Pain in left elbow: Secondary | ICD-10-CM | POA: Diagnosis not present

## 2017-02-02 DIAGNOSIS — S42352D Displaced comminuted fracture of shaft of humerus, left arm, subsequent encounter for fracture with routine healing: Secondary | ICD-10-CM | POA: Diagnosis not present

## 2017-02-02 DIAGNOSIS — M25512 Pain in left shoulder: Secondary | ICD-10-CM | POA: Diagnosis not present

## 2017-02-05 DIAGNOSIS — F5101 Primary insomnia: Secondary | ICD-10-CM | POA: Diagnosis not present

## 2017-02-05 DIAGNOSIS — G478 Other sleep disorders: Secondary | ICD-10-CM | POA: Diagnosis not present

## 2017-02-05 DIAGNOSIS — R0683 Snoring: Secondary | ICD-10-CM | POA: Diagnosis not present

## 2017-02-19 DIAGNOSIS — S42355G Nondisplaced comminuted fracture of shaft of humerus, left arm, subsequent encounter for fracture with delayed healing: Secondary | ICD-10-CM | POA: Diagnosis not present

## 2017-02-23 DIAGNOSIS — G4733 Obstructive sleep apnea (adult) (pediatric): Secondary | ICD-10-CM | POA: Insufficient documentation

## 2017-03-12 DIAGNOSIS — G4733 Obstructive sleep apnea (adult) (pediatric): Secondary | ICD-10-CM | POA: Diagnosis not present

## 2017-03-21 DIAGNOSIS — S42355G Nondisplaced comminuted fracture of shaft of humerus, left arm, subsequent encounter for fracture with delayed healing: Secondary | ICD-10-CM | POA: Diagnosis not present

## 2017-04-12 DIAGNOSIS — G4733 Obstructive sleep apnea (adult) (pediatric): Secondary | ICD-10-CM | POA: Diagnosis not present

## 2017-04-16 DIAGNOSIS — M1712 Unilateral primary osteoarthritis, left knee: Secondary | ICD-10-CM | POA: Diagnosis not present

## 2017-05-03 DIAGNOSIS — Z86010 Personal history of colonic polyps: Secondary | ICD-10-CM | POA: Diagnosis not present

## 2017-05-07 DIAGNOSIS — I10 Essential (primary) hypertension: Secondary | ICD-10-CM | POA: Diagnosis not present

## 2017-05-07 DIAGNOSIS — F5101 Primary insomnia: Secondary | ICD-10-CM | POA: Diagnosis not present

## 2017-05-07 DIAGNOSIS — G4733 Obstructive sleep apnea (adult) (pediatric): Secondary | ICD-10-CM | POA: Diagnosis not present

## 2017-05-13 DIAGNOSIS — G4733 Obstructive sleep apnea (adult) (pediatric): Secondary | ICD-10-CM | POA: Diagnosis not present

## 2017-06-05 DIAGNOSIS — Z86010 Personal history of colonic polyps: Secondary | ICD-10-CM | POA: Diagnosis not present

## 2017-06-05 DIAGNOSIS — K648 Other hemorrhoids: Secondary | ICD-10-CM | POA: Diagnosis not present

## 2017-06-05 DIAGNOSIS — K5730 Diverticulosis of large intestine without perforation or abscess without bleeding: Secondary | ICD-10-CM | POA: Diagnosis not present

## 2017-06-05 DIAGNOSIS — D122 Benign neoplasm of ascending colon: Secondary | ICD-10-CM | POA: Diagnosis not present

## 2017-06-05 DIAGNOSIS — K621 Rectal polyp: Secondary | ICD-10-CM | POA: Diagnosis not present

## 2017-06-05 DIAGNOSIS — Z1211 Encounter for screening for malignant neoplasm of colon: Secondary | ICD-10-CM | POA: Diagnosis not present

## 2017-06-05 DIAGNOSIS — D126 Benign neoplasm of colon, unspecified: Secondary | ICD-10-CM | POA: Diagnosis not present

## 2017-06-05 DIAGNOSIS — K620 Anal polyp: Secondary | ICD-10-CM | POA: Diagnosis not present

## 2017-06-05 LAB — HM COLONOSCOPY

## 2017-06-10 DIAGNOSIS — G4733 Obstructive sleep apnea (adult) (pediatric): Secondary | ICD-10-CM | POA: Diagnosis not present

## 2017-06-25 DIAGNOSIS — G4733 Obstructive sleep apnea (adult) (pediatric): Secondary | ICD-10-CM | POA: Diagnosis not present

## 2017-06-27 DIAGNOSIS — I10 Essential (primary) hypertension: Secondary | ICD-10-CM | POA: Diagnosis not present

## 2017-06-27 DIAGNOSIS — Z23 Encounter for immunization: Secondary | ICD-10-CM | POA: Diagnosis not present

## 2017-06-27 DIAGNOSIS — E782 Mixed hyperlipidemia: Secondary | ICD-10-CM | POA: Diagnosis not present

## 2017-06-27 DIAGNOSIS — F419 Anxiety disorder, unspecified: Secondary | ICD-10-CM | POA: Insufficient documentation

## 2017-07-11 DIAGNOSIS — G4733 Obstructive sleep apnea (adult) (pediatric): Secondary | ICD-10-CM | POA: Diagnosis not present

## 2017-07-23 DIAGNOSIS — L718 Other rosacea: Secondary | ICD-10-CM | POA: Diagnosis not present

## 2017-07-23 DIAGNOSIS — L738 Other specified follicular disorders: Secondary | ICD-10-CM | POA: Diagnosis not present

## 2017-08-06 ENCOUNTER — Ambulatory Visit
Admission: RE | Admit: 2017-08-06 | Discharge: 2017-08-06 | Disposition: A | Discharge status: Home / Self Care | Payer: 59 | Source: Ambulatory Visit | Attending: Internal Medicine | Admitting: Internal Medicine

## 2017-08-06 ENCOUNTER — Encounter (INDEPENDENT_AMBULATORY_CARE_PROVIDER_SITE_OTHER): Payer: Self-pay

## 2017-08-06 DIAGNOSIS — Z1231 Encounter for screening mammogram for malignant neoplasm of breast: Secondary | ICD-10-CM

## 2017-08-07 ENCOUNTER — Other Ambulatory Visit: Payer: Self-pay | Admitting: Internal Medicine

## 2017-08-07 DIAGNOSIS — R921 Mammographic calcification found on diagnostic imaging of breast: Secondary | ICD-10-CM | POA: Insufficient documentation

## 2017-08-07 DIAGNOSIS — R928 Other abnormal and inconclusive findings on diagnostic imaging of breast: Secondary | ICD-10-CM

## 2017-08-10 DIAGNOSIS — G4733 Obstructive sleep apnea (adult) (pediatric): Secondary | ICD-10-CM | POA: Diagnosis not present

## 2017-08-15 DIAGNOSIS — D485 Neoplasm of uncertain behavior of skin: Secondary | ICD-10-CM | POA: Diagnosis not present

## 2017-08-15 DIAGNOSIS — B372 Candidiasis of skin and nail: Secondary | ICD-10-CM | POA: Diagnosis not present

## 2017-08-15 DIAGNOSIS — L578 Other skin changes due to chronic exposure to nonionizing radiation: Secondary | ICD-10-CM | POA: Diagnosis not present

## 2017-08-23 ENCOUNTER — Ambulatory Visit
Admission: RE | Admit: 2017-08-23 | Discharge: 2017-08-23 | Disposition: A | Discharge status: Home / Self Care | Payer: 59 | Source: Ambulatory Visit | Attending: Internal Medicine | Admitting: Internal Medicine

## 2017-08-23 DIAGNOSIS — R921 Mammographic calcification found on diagnostic imaging of breast: Secondary | ICD-10-CM

## 2017-08-23 DIAGNOSIS — R928 Other abnormal and inconclusive findings on diagnostic imaging of breast: Secondary | ICD-10-CM

## 2017-08-27 DIAGNOSIS — E782 Mixed hyperlipidemia: Secondary | ICD-10-CM | POA: Diagnosis not present

## 2017-08-27 DIAGNOSIS — G4733 Obstructive sleep apnea (adult) (pediatric): Secondary | ICD-10-CM | POA: Diagnosis not present

## 2017-08-27 DIAGNOSIS — I10 Essential (primary) hypertension: Secondary | ICD-10-CM | POA: Diagnosis not present

## 2017-09-05 DIAGNOSIS — D2271 Melanocytic nevi of right lower limb, including hip: Secondary | ICD-10-CM | POA: Diagnosis not present

## 2017-09-05 DIAGNOSIS — D485 Neoplasm of uncertain behavior of skin: Secondary | ICD-10-CM | POA: Diagnosis not present

## 2017-09-10 DIAGNOSIS — G4733 Obstructive sleep apnea (adult) (pediatric): Secondary | ICD-10-CM | POA: Diagnosis not present

## 2017-10-06 DIAGNOSIS — I10 Essential (primary) hypertension: Secondary | ICD-10-CM | POA: Diagnosis not present

## 2017-10-06 DIAGNOSIS — G4733 Obstructive sleep apnea (adult) (pediatric): Secondary | ICD-10-CM | POA: Diagnosis not present

## 2017-10-10 DIAGNOSIS — G4733 Obstructive sleep apnea (adult) (pediatric): Secondary | ICD-10-CM | POA: Diagnosis not present

## 2017-10-10 DIAGNOSIS — R5383 Other fatigue: Secondary | ICD-10-CM | POA: Diagnosis not present

## 2017-10-12 ENCOUNTER — Encounter: Payer: Self-pay | Admitting: Dietician

## 2017-10-12 ENCOUNTER — Encounter: Payer: 59 | Attending: Internal Medicine | Admitting: Dietician

## 2017-10-12 VITALS — Ht 66.0 in | Wt 233.1 lb

## 2017-10-12 DIAGNOSIS — Z6837 Body mass index (BMI) 37.0-37.9, adult: Secondary | ICD-10-CM

## 2017-10-12 DIAGNOSIS — I10 Essential (primary) hypertension: Secondary | ICD-10-CM | POA: Diagnosis not present

## 2017-10-12 DIAGNOSIS — E66812 Obesity, class 2: Secondary | ICD-10-CM

## 2017-10-12 DIAGNOSIS — E669 Obesity, unspecified: Secondary | ICD-10-CM | POA: Insufficient documentation

## 2017-10-12 DIAGNOSIS — Z713 Dietary counseling and surveillance: Secondary | ICD-10-CM | POA: Insufficient documentation

## 2017-10-12 NOTE — Patient Instructions (Signed)
   Plan to eat 3 meals daily; eat something every 3-5 hours while awake.   Begin tracking food intake using app such as MyFitnessPal, Lose It, or Calorie [employer], or [employer].

## 2017-10-12 NOTE — Progress Notes (Signed)
Medical Nutrition Therapy: Visit start time: 1330  end time: 1430  Assessment:  Diagnosis: HTN, obesity Past medical history: sleep apnea, insomnia Psychosocial issues/ stress concerns: patient reports some increased stress  Preferred learning method:  . Auditory   Current weight: 233.1lbs Height: 5'6" Medications, supplements: reconciled list in medical record  Progress and evaluation: Patient states BP might have been increased recently due to some alcohol intake and personal stress/ life changes. She reports insomnia in past months since arm fracture when she slept in recliner for 2 months; she does well with medication but is only able to take it up to 4x a week, and does not sleep well at all the other nights. She reports low energy during the day, sleeps late after being awake for several hours during the night. She wants to work on weight loss to help BP control, energy, cholesterol.     Physical activity: walking/ yardwork 30-45 minutes 2x a week. Some swimming in personal pool; increased housework preparing to sell home; has gym membership  Dietary Intake:  Usual eating pattern includes 2-3 meals and 2-3 small snacks per day. Dining out frequency: 1 meals per week.  Breakfast: diet pepsi (for caffeine); sometimes no appetite so skips; sometimes yogurt, or eggs/ omelet with spinach Snack: none or almonds, sometimes cookie Lunch: salads, fruit Snack: plain almonds, maybe cookie(s)  Supper: sometimes cooked meal ie chicken skinless with vegetabes spinach, cucumber, sometimes fruit ie watermelon. Sometimes snack foods-- cheese and crackers; egg(s); cereal Snack: none or same as pm; sometimes snacks during the night due to insomnia Beverages: filtered water, Perrier, occ mixed with wine, diet pepsi, 1-2 beers daily  Nutrition Care Education: Topics covered: weight control, HTN Basic nutrition: basic food groups, appropriate nutrient balance, appropriate meal and snack schedule,  general nutrition guidelines    Weight control: benefits of weight control, importance of lowfat and low sugar foods, appropriate food portions, benefits of monitoring food intake, role of physical activity, balanced meal options. Hypertension: goal for sodium intake, identifying high sodium foods, identifying food sources of Calcium, potassium, magnesium, effects of alcohol Other:  Meal and snack pattern to promote sleep such as avoiding large meals/ large protein portions at night, choosing milk, healthy carbs such as fruit or graham crackers, cherries. Discussed effects of alcohol on sleep.  Nutritional Diagnosis:  [place]-2.2 Altered nutrition-related laboratory As related to HTN.  As evidenced by patient and MD reports of elevated BP. [place]-3.3 Overweight/obesity As related to excess calories, inactivity, insomnia.  As evidenced by patient with BMI of 37.62, patient report of sleep issues and dietary intake. .  Intervention: Instruction and discussion as noted above.   Set goals with direction from patient.    Patient has good nutrition knowledge base, and is motivated to make positive lifestyle changes.   Education Materials given:  . General diet guidelines for Heart health including hypertension . Food lists/ Planning A Balanced Meal . Sample meal pattern/ menus . Goals/ instructions   Learner/ who was taught:  . Patient   Level of understanding: Marland Kitchen Verbalizes/ demonstrates competency   Demonstrated degree of understanding via:   Teach back Learning barriers: . None  Willingness to learn/ readiness for change: . Eager, change in progress  Monitoring and Evaluation:  Dietary intake, exercise, BP control, and body weight      follow up: 11/13/17

## 2017-11-10 DIAGNOSIS — G4733 Obstructive sleep apnea (adult) (pediatric): Secondary | ICD-10-CM | POA: Diagnosis not present

## 2017-11-13 ENCOUNTER — Ambulatory Visit: Payer: 59 | Admitting: Dietician

## 2017-11-23 ENCOUNTER — Encounter: Payer: Self-pay | Admitting: Dietician

## 2017-11-23 NOTE — Progress Notes (Signed)
Patient cancelled her appointment for 11/13/17 and does not wish to reschedule. Sent letter to referring provider.

## 2017-11-27 IMAGING — MR MR KNEE*R* W/O CM
5 series · 38 of 40 positions shown · non-contrast
Comparison: None.

CLINICAL DATA: Acute right knee pain.  Fluid removed 3 weeks ago.

EXAM:
MRI OF THE RIGHT KNEE WITHOUT CONTRAST
TECHNIQUE: Multiplanar, multisequence MR imaging of the knee was performed. No
intravenous contrast was administered.

[Series 3: PD fat-sat · axial · 3.0mm · 0.33mm/px · z∈[-75,+37]mm · 8 of 35 slices shown (1 of 3)]
[im 1/35]
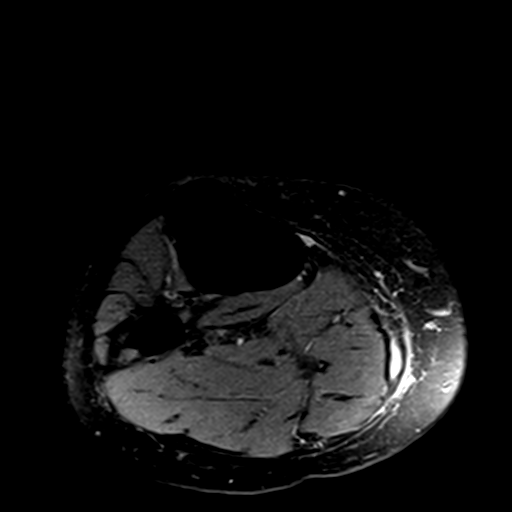
[im 5/35]
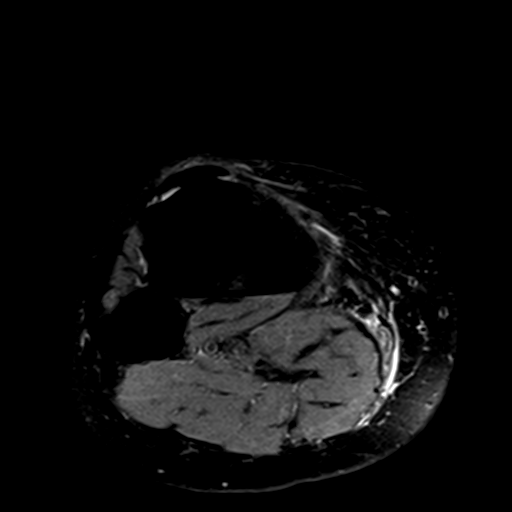
[im 10/35]
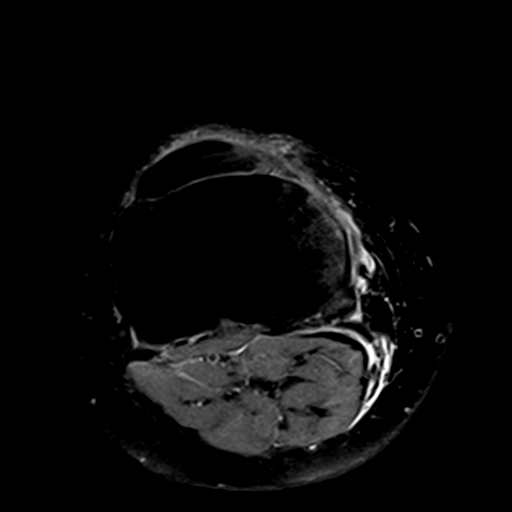
[im 15/35]
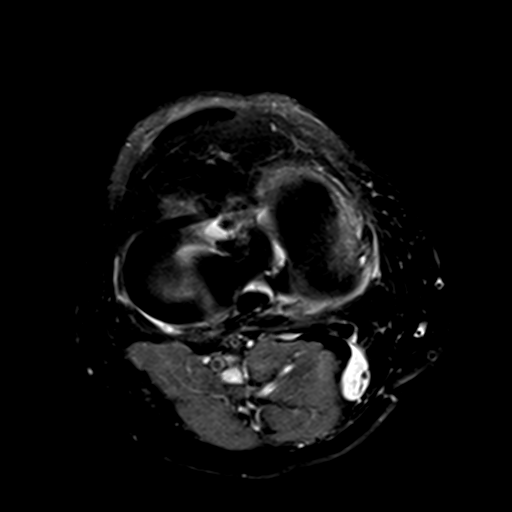
[im 20/35]
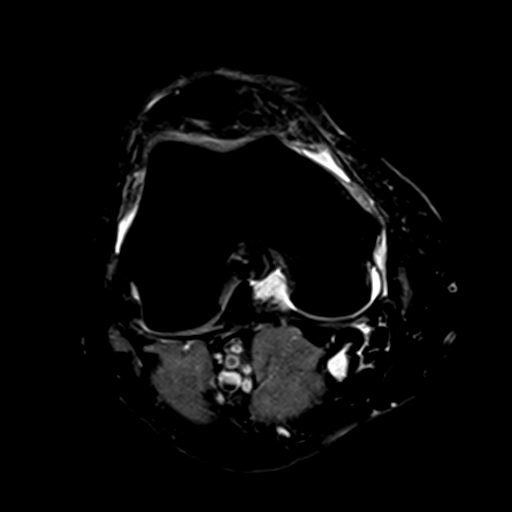
[im 25/35]
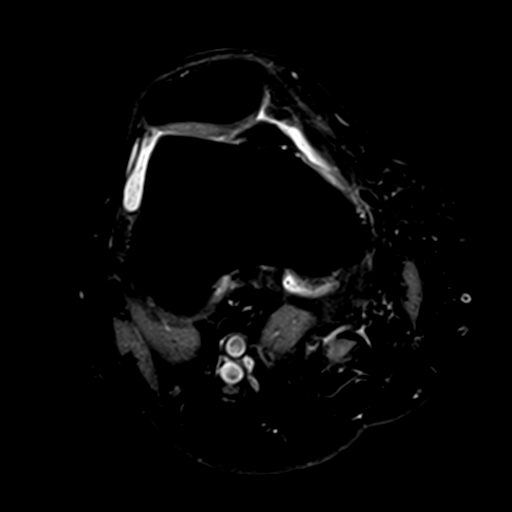
[im 30/35]
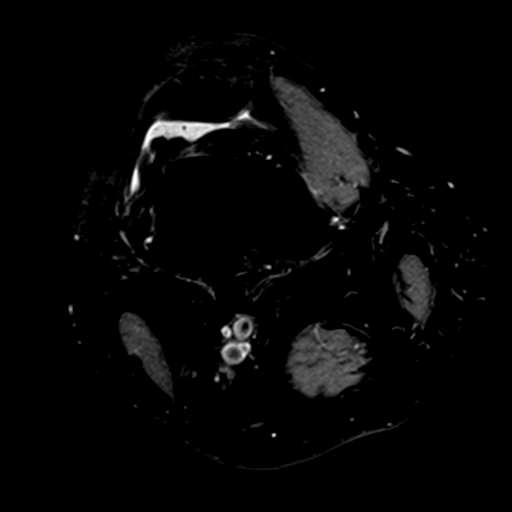
[im 35/35]
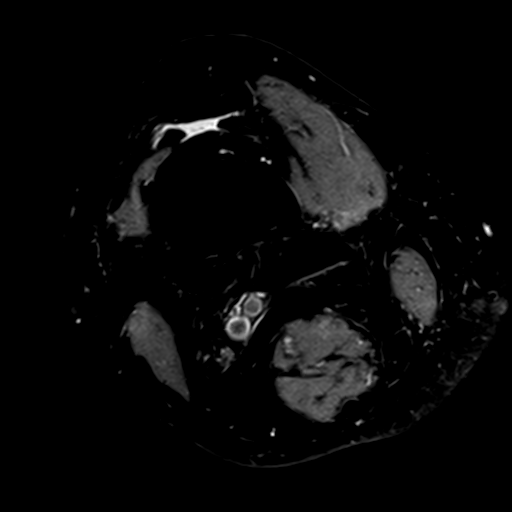

[Series 4: T1 · coronal · 3.0mm · 0.50mm/px · 6 of 32 slices shown]
[im 1/32]
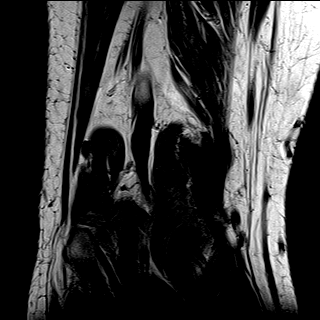
[im 5/32]
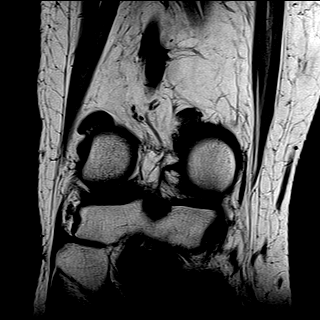
[im 9/32]
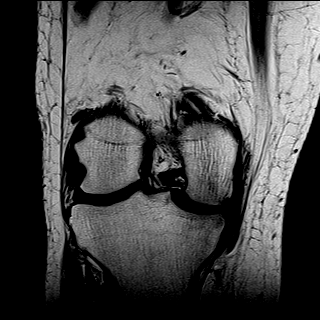
[im 14/32]
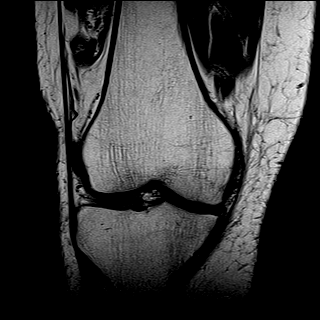
[im 18/32]
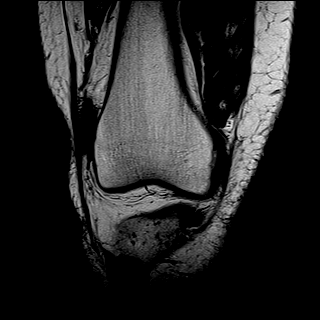
[im 23/32]
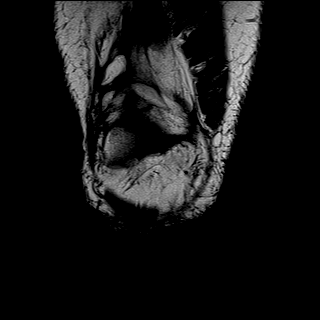

[Series 5: PD fat-sat · sagittal · 3.0mm · 0.62mm/px · 8 of 35 slices shown (2 of 3)]
[im 1/35]
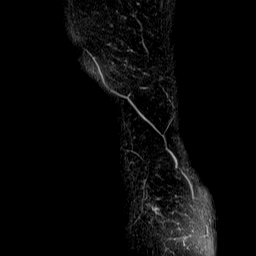
[im 5/35]
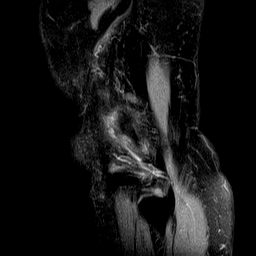
[im 10/35]
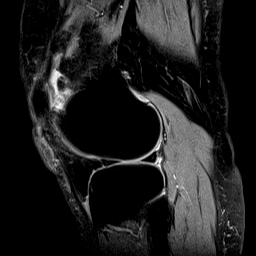
[im 15/35]
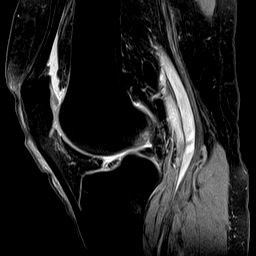
[im 20/35]
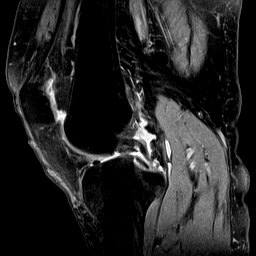
[im 25/35]
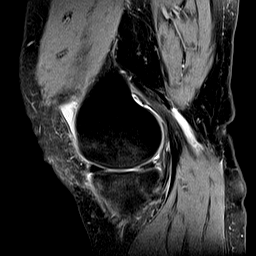
[im 30/35]
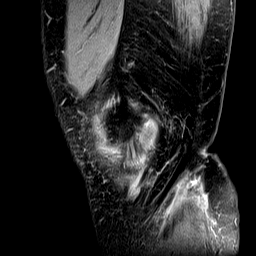
[im 35/35]
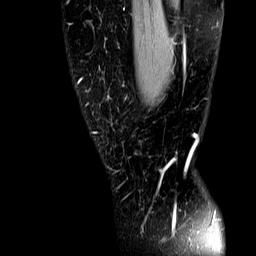

[Series 6: T2 fat-sat · coronal · 3.0mm · 0.50mm/px · 8 of 32 slices shown]
[im 1/32]
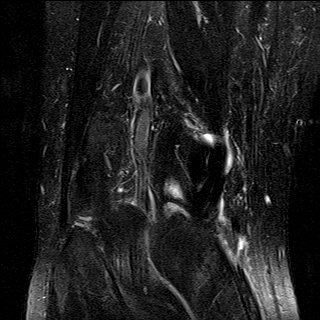
[im 5/32]
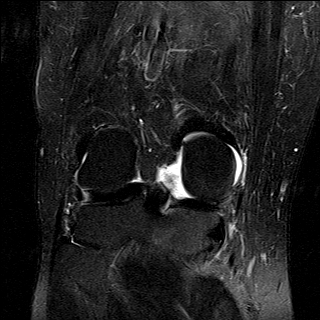
[im 9/32]
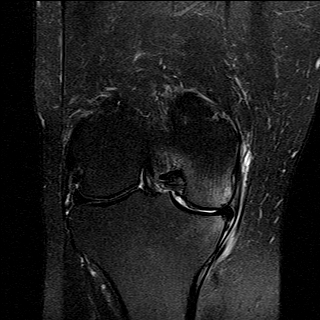
[im 14/32]
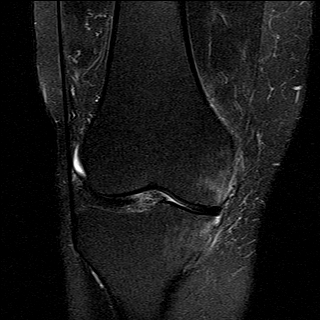
[im 18/32]
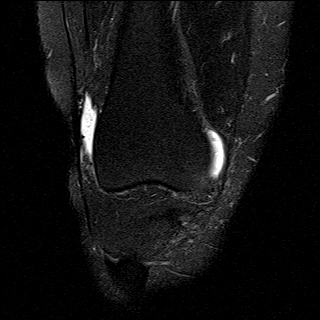
[im 23/32]
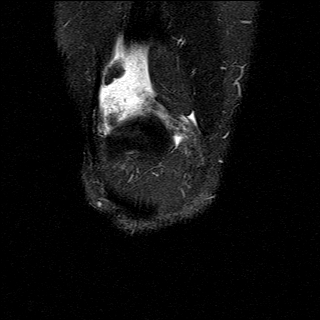
[im 27/32]
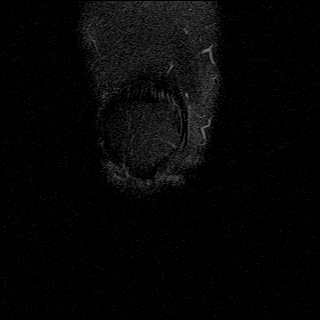
[im 32/32]
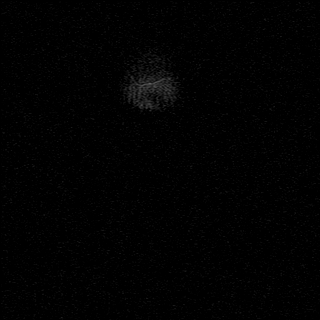

[Series 7: PD fat-sat · coronal · 3.0mm · 0.62mm/px · 8 of 32 slices shown (3 of 3)]
[im 1/32]
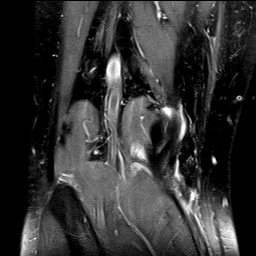
[im 5/32]
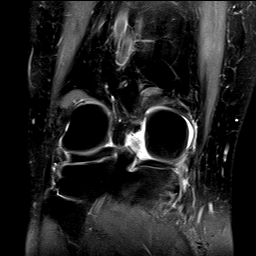
[im 9/32]
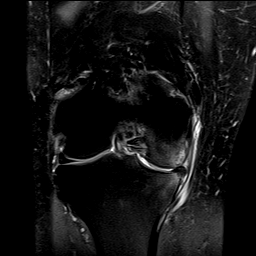
[im 14/32]
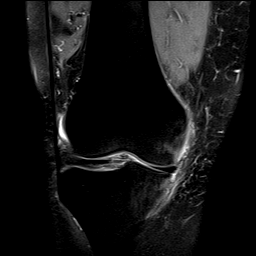
[im 18/32]
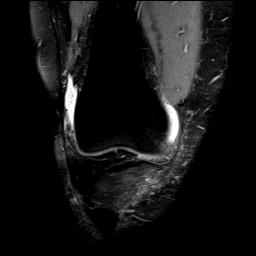
[im 23/32]
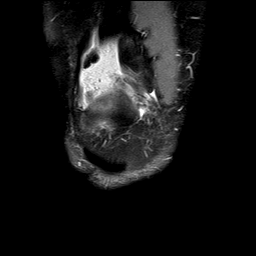
[im 27/32]
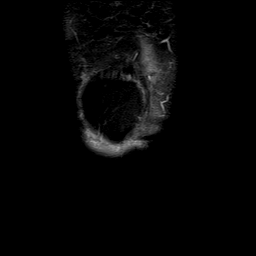
[im 32/32]
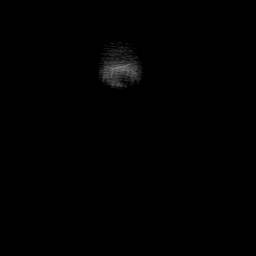

[38 of 40 positions shown; findings below may reference images not displayed]

FINDINGS: MENISCI

Medial meniscus: Complex tear of the body and posterior horn of the
medial meniscus with a radial component

Lateral meniscus:  Intact.

LIGAMENTS

Cruciates:  Intact ACL and PCL.

Collaterals: Medial collateral ligament is intact. Small amount of
fluid superficial to the MCL likely reactive secondary to meniscal
pathology. Lateral collateral ligament complex is intact.

CARTILAGE

Patellofemoral: Partial-thickness cartilage loss of the medial
patellar facet with mild subchondral reactive marrow changes.

Medial: High-grade partial-thickness cartilage loss of the medial
femoral condyle and medial tibial plateau with subchondral reactive
marrow edema.

Lateral:  No chondral defect.

Joint: Small joint effusion. Mild edema in superolateral Hoffa's
fat. No plical thickening.

Popliteal Fossa:  Small Baker cyst.  Intact popliteus tendon.

Extensor Mechanism: Intact quadriceps tendon and patellar tendon.
Intact medial and lateral patellar retinaculum. Intact MPFL.

Bones: No other marrow signal abnormality. No fracture or
dislocation. No aggressive osseous lesion.

Other: No fluid collection or hematoma.
IMPRESSION: 1. Complex tear of the body and posterior horn of the medial
meniscus with a radial component
2. High-grade partial-thickness cartilage loss of the medial femoral
condyle and medial tibial plateau with subchondral reactive marrow
edema.
3. Partial-thickness cartilage loss of the medial patellar facet
with mild subchondral reactive marrow changes.
4. Small joint effusion.

## 2017-12-11 DIAGNOSIS — G4733 Obstructive sleep apnea (adult) (pediatric): Secondary | ICD-10-CM | POA: Diagnosis not present

## 2017-12-28 DIAGNOSIS — I10 Essential (primary) hypertension: Secondary | ICD-10-CM | POA: Diagnosis not present

## 2017-12-28 DIAGNOSIS — Z79899 Other long term (current) drug therapy: Secondary | ICD-10-CM | POA: Diagnosis not present

## 2017-12-28 DIAGNOSIS — E782 Mixed hyperlipidemia: Secondary | ICD-10-CM | POA: Diagnosis not present

## 2018-02-13 DIAGNOSIS — G4733 Obstructive sleep apnea (adult) (pediatric): Secondary | ICD-10-CM | POA: Diagnosis not present

## 2018-02-18 DIAGNOSIS — Z1283 Encounter for screening for malignant neoplasm of skin: Secondary | ICD-10-CM | POA: Diagnosis not present

## 2018-02-18 DIAGNOSIS — Z86018 Personal history of other benign neoplasm: Secondary | ICD-10-CM | POA: Diagnosis not present

## 2018-02-18 DIAGNOSIS — L578 Other skin changes due to chronic exposure to nonionizing radiation: Secondary | ICD-10-CM | POA: Diagnosis not present

## 2018-04-30 DIAGNOSIS — H269 Unspecified cataract: Secondary | ICD-10-CM | POA: Diagnosis not present

## 2018-05-09 DIAGNOSIS — E6609 Other obesity due to excess calories: Secondary | ICD-10-CM | POA: Diagnosis not present

## 2018-06-07 DIAGNOSIS — G4733 Obstructive sleep apnea (adult) (pediatric): Secondary | ICD-10-CM | POA: Diagnosis not present

## 2018-06-28 DIAGNOSIS — I10 Essential (primary) hypertension: Secondary | ICD-10-CM | POA: Diagnosis not present

## 2018-06-28 DIAGNOSIS — E782 Mixed hyperlipidemia: Secondary | ICD-10-CM | POA: Diagnosis not present

## 2018-06-28 DIAGNOSIS — Z79899 Other long term (current) drug therapy: Secondary | ICD-10-CM | POA: Diagnosis not present

## 2019-06-30 ENCOUNTER — Other Ambulatory Visit: Payer: Self-pay | Admitting: Internal Medicine

## 2019-06-30 DIAGNOSIS — Z1231 Encounter for screening mammogram for malignant neoplasm of breast: Secondary | ICD-10-CM

## 2019-07-15 DIAGNOSIS — M8580 Other specified disorders of bone density and structure, unspecified site: Secondary | ICD-10-CM | POA: Insufficient documentation

## 2019-10-28 DIAGNOSIS — G4733 Obstructive sleep apnea (adult) (pediatric): Secondary | ICD-10-CM | POA: Diagnosis not present

## 2019-11-14 DIAGNOSIS — Z6822 Body mass index (BMI) 22.0-22.9, adult: Secondary | ICD-10-CM | POA: Diagnosis not present

## 2019-11-14 DIAGNOSIS — E782 Mixed hyperlipidemia: Secondary | ICD-10-CM | POA: Diagnosis not present

## 2019-11-14 DIAGNOSIS — N951 Menopausal and female climacteric states: Secondary | ICD-10-CM | POA: Diagnosis not present

## 2019-11-20 DIAGNOSIS — R635 Abnormal weight gain: Secondary | ICD-10-CM | POA: Diagnosis not present

## 2019-11-20 DIAGNOSIS — Z1339 Encounter for screening examination for other mental health and behavioral disorders: Secondary | ICD-10-CM | POA: Diagnosis not present

## 2019-11-20 DIAGNOSIS — N951 Menopausal and female climacteric states: Secondary | ICD-10-CM | POA: Diagnosis not present

## 2019-11-20 DIAGNOSIS — Z1331 Encounter for screening for depression: Secondary | ICD-10-CM | POA: Diagnosis not present

## 2019-11-24 DIAGNOSIS — Z6822 Body mass index (BMI) 22.0-22.9, adult: Secondary | ICD-10-CM | POA: Diagnosis not present

## 2019-11-24 DIAGNOSIS — I10 Essential (primary) hypertension: Secondary | ICD-10-CM | POA: Diagnosis not present

## 2019-12-02 ENCOUNTER — Encounter: Payer: Self-pay | Admitting: Emergency Medicine

## 2019-12-02 ENCOUNTER — Other Ambulatory Visit: Payer: Self-pay

## 2019-12-02 ENCOUNTER — Ambulatory Visit
Admission: EM | Admit: 2019-12-02 | Discharge: 2019-12-02 | Disposition: A | Discharge status: Home / Self Care | Payer: Medicare Other | Attending: Family Medicine | Admitting: Family Medicine

## 2019-12-02 DIAGNOSIS — I10 Essential (primary) hypertension: Secondary | ICD-10-CM

## 2019-12-02 MED ORDER — AMLODIPINE BESYLATE 5 MG PO TABS: 5.0000 mg | ORAL_TABLET | Freq: Every day | ORAL | 0 refills | Status: DC

## 2019-12-02 NOTE — ED Triage Notes (Signed)
Patient states she is in a weight loss program at [facility]. She had an appointment yesterday and her BP was 180/100. She does take BP medication. She called x 2 to her PCP with no return call so she came here.

## 2019-12-02 NOTE — Discharge Instructions (Signed)
Medication as prescribed.  Follow up with Dr. Dorthula Perfect.  Take care  Dr. Lacinda Axon

## 2019-12-03 NOTE — ED Provider Notes (Signed)
[facility]    CSN: [id number] Arrival date & time: 12/02/19  1230      History   Chief Complaint Chief Complaint  Patient presents with  . Hypertension   HPI  64 year old female presents with elevated blood pressure.  Patient reports that she is participating in a weight loss program.  She states that she was seen yesterday and her blood pressure was markedly elevated.  She is compliant with her home blood pressure medication of metoprolol and lisinopril.  She has tried to contact her PCP but has not received a phone call back.  Patient is very concerned that her blood pressure is still elevated.  It has been as high as 180/100.  Currently it is 180/111.  Patient brought that she has a headache.  She has no other symptoms.  No chest pain or shortness of breath.  No other complaints at this time.  Past Medical History:  Diagnosis Date  . Arthritis   . Hyperlipidemia   . Hypertension    Past Surgical History:  Procedure Laterality Date  . COLONOSCOPY      OB History   No obstetric history on file.      Home Medications    Prior to Admission medications   Medication Sig Start Date End Date Taking? Authorizing Provider  albuterol (VENTOLIN HFA) 108 (90 Base) MCG/ACT inhaler Inhale into the lungs. 06/22/16  Yes [provider]  aspirin EC 81 MG tablet Take 81 mg by mouth daily.   Yes [provider]  lisinopril (PRINIVIL,ZESTRIL) 20 MG tablet Take 40 mg by mouth daily.  09/17/17  Yes [provider]  meclizine (ANTIVERT) 25 MG tablet Take by mouth. 12/24/15  Yes [provider]  metoprolol succinate (TOPROL-XL) 50 MG 24 hr tablet Take 50 mg by mouth at bedtime. Take with or immediately following a meal.    Yes [provider]  Multiple Vitamin (MULTIVITAMIN WITH MINERALS) TABS tablet Take 1 tablet by mouth daily.   Yes [provider]  Omega-3 Fatty Acids (FISH OIL PO) Take 2 capsules by mouth daily.   Yes  [provider]  simvastatin (ZOCOR) 20 MG tablet Take 20 mg by mouth daily at 6 PM.    Yes [provider]  zolpidem (AMBIEN) 5 MG tablet TAKE 1 TABLET BY MOUTH NIGHTLY AS NEEDED FOR SLEEP. MAXIMUM OF 4 TIMES PER WEEK 09/21/17  Yes [provider]  FLUoxetine (PROZAC) 20 MG capsule Take 20-40 mg by mouth every morning. USUALLY JUST TAKES 1 BUT WILL TAKE 2 IF NEEDED  08/31/16 12/02/19 Yes [provider]  amLODipine (NORVASC) 5 MG tablet Take 1 tablet (5 mg total) by mouth daily. 12/02/19   Coral Spikes, DO  felodipine (PLENDIL) 5 MG 24 hr tablet Take 5 mg by mouth at bedtime.   12/02/19  [provider]    Family History Family History  Problem Relation Age of Onset  . Breast cancer Neg Hx     Social History Social History   Tobacco Use  . Smoking status: Never Smoker  . Smokeless tobacco: Never Used  Vaping Use  . Vaping Use: Never used  Substance Use Topics  . Alcohol use: Yes    Alcohol/week: 14.0 standard drinks    Types: 14 Cans of beer per week  . Drug use: No     Allergies   Patient has no known allergies.   Review of Systems Review of Systems  Constitutional: Negative.   Neurological:  Positive for headaches.   Physical Exam Triage Vital Signs ED Triage Vitals  Enc Vitals Group     BP 12/02/19 1321 (!) 180/111     Pulse Rate 12/02/19 1321 78     Resp 12/02/19 1321 18     Temp 12/02/19 1321 98 F (36.7 C)     Temp Source 12/02/19 1321 Oral     SpO2 12/02/19 1321 100 %     Weight 12/02/19 1318 193 lb (87.5 kg)     Height 12/02/19 1318 5\' 6"  (1.676 m)     Head Circumference --      Peak Flow --      Pain Score 12/02/19 1318 4     Pain Loc --      Pain Edu? --      Excl. in [place]? --    Updated Vital Signs BP (!) 180/111 (BP Location: Right Arm)   Pulse 78   Temp 98 F (36.7 C) (Oral)   Resp 18   Ht 5\' 6"  (1.676 m)   Wt 87.5 kg   SpO2 100%   BMI 31.15 kg/m   Visual Acuity Right Eye Distance:   Left Eye  Distance:   Bilateral Distance:    Right Eye Near:   Left Eye Near:    Bilateral Near:     Physical Exam Vitals and nursing note reviewed.  Constitutional:      General: She is not in acute distress.    Appearance: Normal appearance. She is not ill-appearing.  HENT:     Head: Normocephalic and atraumatic.  Eyes:     General:        Right eye: No discharge.        Left eye: No discharge.     Conjunctiva/sclera: Conjunctivae normal.  Cardiovascular:     Rate and Rhythm: Normal rate and regular rhythm.     Heart sounds: No murmur heard.   Pulmonary:     Effort: Pulmonary effort is normal.     Breath sounds: Normal breath sounds. No wheezing, rhonchi or rales.  Neurological:     Mental Status: She is alert.  Psychiatric:        Mood and Affect: Mood normal.        Behavior: Behavior normal.    UC Treatments / Results  Labs (all labs ordered are listed, but only abnormal results are displayed) Labs Reviewed - No data to display  EKG   Radiology No results found.  Procedures Procedures (including critical care time)  Medications Ordered in UC Medications - No data to display  Initial Impression / Assessment and Plan / UC Course  I have reviewed the triage vital signs and the nursing notes.  Pertinent labs & imaging results that were available during my care of the patient were reviewed by me and considered in my medical decision making (see chart for details).    64 year old female presents with elevated blood pressure.  Chronic problem with acute exacerbation/worsening.  Continue lisinopril and metoprolol.  Adding amlodipine.  Final Clinical Impressions(s) / UC Diagnoses   Final diagnoses:  Essential hypertension     Discharge Instructions     Medication as prescribed.  Follow up with Dr. Dorthula Perfect.  Take care  Dr. Lacinda Axon    ED Prescriptions    Medication Sig Dispense Auth. Provider   amLODipine (NORVASC) 5 MG tablet Take 1 tablet (5 mg total) by  mouth daily. 90 tablet Coral Spikes, [place]     PDMP  not reviewed this encounter.   Thersa Salt [place], [place] 12/03/19 [phone]

## 2019-12-05 DIAGNOSIS — I10 Essential (primary) hypertension: Secondary | ICD-10-CM | POA: Diagnosis not present

## 2019-12-05 DIAGNOSIS — R809 Proteinuria, unspecified: Secondary | ICD-10-CM | POA: Diagnosis not present

## 2019-12-05 DIAGNOSIS — Z79899 Other long term (current) drug therapy: Secondary | ICD-10-CM | POA: Diagnosis not present

## 2019-12-05 DIAGNOSIS — F419 Anxiety disorder, unspecified: Secondary | ICD-10-CM | POA: Diagnosis not present

## 2019-12-09 ENCOUNTER — Other Ambulatory Visit: Payer: Self-pay | Admitting: Internal Medicine

## 2019-12-09 DIAGNOSIS — I10 Essential (primary) hypertension: Secondary | ICD-10-CM

## 2019-12-12 ENCOUNTER — Ambulatory Visit
Admission: RE | Admit: 2019-12-12 | Discharge: 2019-12-12 | Disposition: A | Discharge status: Home / Self Care | Payer: Medicare Other | Source: Ambulatory Visit | Attending: Internal Medicine | Admitting: Internal Medicine

## 2019-12-12 ENCOUNTER — Other Ambulatory Visit: Payer: Self-pay

## 2019-12-12 DIAGNOSIS — R93421 Abnormal radiologic findings on diagnostic imaging of right kidney: Secondary | ICD-10-CM | POA: Diagnosis not present

## 2019-12-12 DIAGNOSIS — R93422 Abnormal radiologic findings on diagnostic imaging of left kidney: Secondary | ICD-10-CM | POA: Insufficient documentation

## 2019-12-12 DIAGNOSIS — I10 Essential (primary) hypertension: Secondary | ICD-10-CM | POA: Insufficient documentation

## 2019-12-26 ENCOUNTER — Other Ambulatory Visit: Payer: Self-pay | Admitting: Internal Medicine

## 2019-12-26 DIAGNOSIS — R93429 Abnormal radiologic findings on diagnostic imaging of unspecified kidney: Secondary | ICD-10-CM

## 2019-12-26 DIAGNOSIS — I10 Essential (primary) hypertension: Secondary | ICD-10-CM

## 2019-12-31 DIAGNOSIS — G4733 Obstructive sleep apnea (adult) (pediatric): Secondary | ICD-10-CM | POA: Diagnosis not present

## 2019-12-31 DIAGNOSIS — E785 Hyperlipidemia, unspecified: Secondary | ICD-10-CM | POA: Diagnosis not present

## 2019-12-31 DIAGNOSIS — I10 Essential (primary) hypertension: Secondary | ICD-10-CM | POA: Diagnosis not present

## 2019-12-31 DIAGNOSIS — F419 Anxiety disorder, unspecified: Secondary | ICD-10-CM | POA: Diagnosis not present

## 2020-01-06 ENCOUNTER — Other Ambulatory Visit: Payer: Self-pay

## 2020-01-06 ENCOUNTER — Ambulatory Visit
Admission: RE | Admit: 2020-01-06 | Discharge: 2020-01-06 | Disposition: A | Discharge status: Home / Self Care | Payer: Medicare Other | Source: Ambulatory Visit | Attending: Internal Medicine | Admitting: Internal Medicine

## 2020-01-06 DIAGNOSIS — R93429 Abnormal radiologic findings on diagnostic imaging of unspecified kidney: Secondary | ICD-10-CM | POA: Diagnosis not present

## 2020-01-06 DIAGNOSIS — I10 Essential (primary) hypertension: Secondary | ICD-10-CM | POA: Insufficient documentation

## 2020-01-06 MED ORDER — GADOBUTROL 1 MMOL/ML IV SOLN
8.0000 mL | Freq: Once | INTRAVENOUS | Status: AC | PRN
  Administered 2020-01-06: 8 mL via INTRAVENOUS

## 2020-02-17 ENCOUNTER — Other Ambulatory Visit: Payer: Self-pay

## 2020-02-17 ENCOUNTER — Ambulatory Visit
Admission: RE | Admit: 2020-02-17 | Discharge: 2020-02-17 | Disposition: A | Discharge status: Home / Self Care | Payer: Medicare Other | Source: Ambulatory Visit | Attending: Internal Medicine | Admitting: Internal Medicine

## 2020-02-17 DIAGNOSIS — Z1231 Encounter for screening mammogram for malignant neoplasm of breast: Secondary | ICD-10-CM | POA: Insufficient documentation

## 2020-02-18 DIAGNOSIS — N951 Menopausal and female climacteric states: Secondary | ICD-10-CM | POA: Diagnosis not present

## 2020-02-18 DIAGNOSIS — I10 Essential (primary) hypertension: Secondary | ICD-10-CM | POA: Diagnosis not present

## 2020-02-18 DIAGNOSIS — E782 Mixed hyperlipidemia: Secondary | ICD-10-CM | POA: Diagnosis not present

## 2020-02-18 DIAGNOSIS — Z6831 Body mass index (BMI) 31.0-31.9, adult: Secondary | ICD-10-CM | POA: Diagnosis not present

## 2020-02-20 ENCOUNTER — Other Ambulatory Visit: Payer: Self-pay | Admitting: Family Medicine

## 2020-02-23 DIAGNOSIS — L281 Prurigo nodularis: Secondary | ICD-10-CM | POA: Diagnosis not present

## 2020-02-23 DIAGNOSIS — Z86018 Personal history of other benign neoplasm: Secondary | ICD-10-CM | POA: Diagnosis not present

## 2020-02-23 DIAGNOSIS — L578 Other skin changes due to chronic exposure to nonionizing radiation: Secondary | ICD-10-CM | POA: Diagnosis not present

## 2020-03-03 DIAGNOSIS — Z6831 Body mass index (BMI) 31.0-31.9, adult: Secondary | ICD-10-CM | POA: Diagnosis not present

## 2020-03-03 DIAGNOSIS — I10 Essential (primary) hypertension: Secondary | ICD-10-CM | POA: Diagnosis not present

## 2020-03-15 ENCOUNTER — Ambulatory Visit (INDEPENDENT_AMBULATORY_CARE_PROVIDER_SITE_OTHER): Payer: Medicare Other | Admitting: Internal Medicine

## 2020-03-15 VITALS — BP 172/91 | HR 96 | Temp 98.3°F | Resp 16 | Ht 66.0 in | Wt 183.0 lb

## 2020-03-15 DIAGNOSIS — I10 Essential (primary) hypertension: Secondary | ICD-10-CM | POA: Diagnosis not present

## 2020-03-15 DIAGNOSIS — G4733 Obstructive sleep apnea (adult) (pediatric): Secondary | ICD-10-CM | POA: Diagnosis not present

## 2020-03-15 DIAGNOSIS — Z9989 Dependence on other enabling machines and devices: Secondary | ICD-10-CM

## 2020-03-15 DIAGNOSIS — E663 Overweight: Secondary | ICD-10-CM | POA: Diagnosis not present

## 2020-03-15 DIAGNOSIS — Z7189 Other specified counseling: Secondary | ICD-10-CM | POA: Diagnosis not present

## 2020-03-15 NOTE — Progress Notes (Signed)
[facility] [place], [place] [address]  Pulmonary Sleep Medicine   Office Visit Note  Patient Name: Alejandra Castro DOB: 1955/05/03 MRN [id number]    Chief Complaint: Obstructive Sleep Apnea visit  Brief History:  Alejandra Castro is seen today for follow up The patient has a 3 year history of sleep apnea. Patient is using PAP nightly.  The patient feels more rested after sleeping with PAP. She still has insomnia and is taking Lunesta. Her PCP switched her to this from Ambien and she is waking in the middle of the night. Several years ago she lost her husband, the broke her arm then lost her job. She states that things are starting to settle down.  Her BP  Has been elevated. The patient reports benefiting from PAP use. Reported sleepiness is  improved and the Epworth Sleepiness Score is 3 out of 24. The patient does not take naps. The patient complains of the following: dry mouth.  The compliance download shows excellent compliance with an average use time of 11.5 hours. The AHI is 2.1 but it has been elevated somewhat more over the past month. The patient does not complain of limb movements disrupting sleep. In the last 2 years she has lost weight, started at 232 lbs, today is 183 lbs. States she is losing weight through [employer] [facility].   ROS  General: (-) fever, (-) chills, (-) night sweat Nose and Sinuses: (-) nasal stuffiness or itchiness, (-) postnasal drip, (-) nosebleeds, (-) sinus trouble. Mouth and Throat: (-) sore throat, (-) hoarseness. Neck: (-) swollen glands, (-) enlarged thyroid, (-) neck pain. Respiratory: - cough, - shortness of breath, - wheezing. Neurologic: - numbness, - tingling. Psychiatric: - anxiety, - depression   Current Medication: Outpatient Encounter Medications as of 03/15/2020  Medication Sig  . eszopiclone (LUNESTA) 2 MG TABS tablet Take by mouth.  . metoprolol succinate (TOPROL-XL) 100 MG 24 hr tablet Take by mouth.   . [DISCONTINUED] FLUoxetine (PROZAC) 20 MG capsule Take 20-40 mg by mouth every morning. USUALLY JUST TAKES 1 BUT WILL TAKE 2 IF NEEDED   . albuterol (VENTOLIN HFA) 108 (90 Base) MCG/ACT inhaler Inhale into the lungs.  Marland Kitchen amLODipine (NORVASC) 5 MG tablet Take 1 tablet (5 mg total) by mouth daily.  Marland Kitchen aspirin EC 81 MG tablet Take 81 mg by mouth daily.  Marland Kitchen lisinopril (ZESTRIL) 40 MG tablet SMARTSIG:1 Tablet(s) By Mouth Daily  . meclizine (ANTIVERT) 25 MG tablet Take by mouth.  . Multiple Vitamin (MULTIVITAMIN WITH MINERALS) TABS tablet Take 1 tablet by mouth daily.  . Omega-3 Fatty Acids (FISH OIL PO) Take 2 capsules by mouth daily.  . simvastatin (ZOCOR) 20 MG tablet Take 20 mg by mouth daily at 6 PM.   . zolpidem (AMBIEN) 5 MG tablet TAKE 1 TABLET BY MOUTH NIGHTLY AS NEEDED FOR SLEEP. MAXIMUM OF 4 TIMES PER WEEK  . [DISCONTINUED] felodipine (PLENDIL) 5 MG 24 hr tablet Take 5 mg by mouth at bedtime.   . [DISCONTINUED] lisinopril (PRINIVIL,ZESTRIL) 20 MG tablet Take 40 mg by mouth daily.   . [DISCONTINUED] metoprolol succinate (TOPROL-XL) 50 MG 24 hr tablet Take 50 mg by mouth at bedtime. Take with or immediately following a meal.    No facility-administered encounter medications on file as of 03/15/2020.    Surgical History: Past Surgical History:  Procedure Laterality Date  . COLONOSCOPY      Medical History: Past Medical History:  Diagnosis Date  . Arthritis   .  Hyperlipidemia   . Hypertension     Family History: Non contributory to the present illness  Social History: Social History   Socioeconomic History  . Marital status: Widowed    Spouse name: Not on file  . Number of children: Not on file  . Years of education: Not on file  . Highest education level: Not on file  Occupational History  . Not on file  Tobacco Use  . Smoking status: Never Smoker  . Smokeless tobacco: Never Used  Vaping Use  . Vaping Use: Never used  Substance and Sexual Activity  . Alcohol use:  Yes    Alcohol/week: 14.0 standard drinks    Types: 14 Cans of beer per week  . Drug use: No  . Sexual activity: Not on file  Other Topics Concern  . Not on file  Social History Narrative  . Not on file   Social Determinants of Health   Financial Resource Strain:   . Difficulty of Paying Living Expenses: Not on file  Food Insecurity:   . Worried About Charity fundraiser in the Last Year: Not on file  . Ran Out of Food in the Last Year: Not on file  Transportation Needs:   . Lack of Transportation (Medical): Not on file  . Lack of Transportation (Non-Medical): Not on file  Physical Activity:   . Days of Exercise per Week: Not on file  . Minutes of Exercise per Session: Not on file  Stress:   . Feeling of Stress : Not on file  Social Connections:   . Frequency of Communication with Friends and Family: Not on file  . Frequency of Social Gatherings with Friends and Family: Not on file  . Attends Religious Services: Not on file  . Active Member of Clubs or Organizations: Not on file  . Attends Archivist Meetings: Not on file  . Marital Status: Not on file  Intimate Partner Violence:   . Fear of Current or Ex-Partner: Not on file  . Emotionally Abused: Not on file  . Physically Abused: Not on file  . Sexually Abused: Not on file    Vital Signs: Blood pressure (!) 172/91, pulse 96, temperature 98.3 F (36.8 C), temperature source Temporal, resp. rate 16, height 5\' 6"  (1.676 m), weight 183 lb (83 kg), SpO2 99 %.  Examination: General Appearance: The patient is well-developed, well-nourished, and in no distress. Neck Circumference: 40 Skin: Gross inspection of skin unremarkable. Head: normocephalic, no gross deformities. Eyes: no gross deformities noted. ENT: ears appear grossly normal Neurologic: Alert and oriented. No involuntary movements.    EPWORTH SLEEPINESS SCALE:  Scale:  (0)= no chance of dozing; (1)= slight chance of dozing; (2)= moderate chance  of dozing; (3)= high chance of dozing  Chance  Situtation    Sitting and reading: 0    Watching TV: 0    Sitting Inactive in public: 0    As a passenger in car: 1      Lying down to rest: 2    Sitting and talking: 0    Sitting quielty after lunch: 0    In a car, stopped in traffic: 0   TOTAL SCORE:   3 out of 24    SLEEP STUDIES:  1. Split 02/23/17 - AHI  108, Low SpO2 85%   CPAP COMPLIANCE DATA:  Date Range: 03/13/19 - 03/11/20  Average Daily Use: 11:28 hours  Median Use: 11:32  Compliance for > 4 Hours: 100%  AHI:  2.1 respiratory events per hour  Days Used: 365/365  Mask Leak: 15.5 lpm   95th Percentile Pressure: 8 cmH2O         LABS: No results found for this or any previous visit (from the past 2160 hour(s)).  Radiology: MM 3D SCREEN BREAST BILATERAL  Result Date: 02/18/2020 CLINICAL DATA:  Screening. EXAM: DIGITAL SCREENING BILATERAL MAMMOGRAM WITH TOMO AND CAD COMPARISON:  Previous exam(s). ACR Breast Density Category b: There are scattered areas of fibroglandular density. FINDINGS: There are no findings suspicious for malignancy. Images were processed with CAD. IMPRESSION: No mammographic evidence of malignancy. A result letter of this screening mammogram will be mailed directly to the patient. RECOMMENDATION: Screening mammogram in one year. (Code:SM-B-01Y) BI-RADS CATEGORY  1: Negative. Electronically Signed   By: Audie Pinto M.D.   On: 02/18/2020 13:39    No results found.  MM 3D SCREEN BREAST BILATERAL  Result Date: 02/18/2020 CLINICAL DATA:  Screening. EXAM: DIGITAL SCREENING BILATERAL MAMMOGRAM WITH TOMO AND CAD COMPARISON:  Previous exam(s). ACR Breast Density Category b: There are scattered areas of fibroglandular density. FINDINGS: There are no findings suspicious for malignancy. Images were processed with CAD. IMPRESSION: No mammographic evidence of malignancy. A result letter of this screening mammogram will be mailed  directly to the patient. RECOMMENDATION: Screening mammogram in one year. (Code:SM-B-01Y) BI-RADS CATEGORY  1: Negative. Electronically Signed   By: Audie Pinto M.D.   On: 02/18/2020 13:39      Assessment and Plan: Patient Active Problem List   Diagnosis Date Noted  . CPAP use counseling 03/15/2020  . Overweight (BMI 25.0-29.9) 03/15/2020  . OSA on CPAP 02/23/2017  . Benign essential hypertension 12/10/2013  . Asthma without status asthmaticus 12/10/2013      The patient does tolerate PAP and reports significant benefit from PAP use. The patient is caring for her machine as recommended and advised to enroll in [facility]. She wants to try a different nasal mask. The patient  is exercising aerobically and with weights. The compliance is excellent. The apnea is well controlled but increasing somewhat. Will get a download in a month..   1. OSA- continue excellent compliance. 2.   CPAP couseling-Discussed importance of adequate CPAP use as well as proper care and cleaning techniques of machine and all supplies. 3.   HTN - elevated in clinic today. On multiple medications, takes BP measurements daily at home 140s/80s. Managed by PCP, Dr. Raechel Ache. 4.  OVERWEIGHT (BMI > 27% with co-morbidity) - Lost approx 50lb in two years, following [employer] [facility] [facility] system. Congratulated on weight loss, encourage to continue utilizing healthy lifestyle measures such as healthy eating and exercise as tolerated.  Discussed the negative effects obesity has on pulmonary health, cardiac health as well as overall general health and well being.   General Counseling: I have discussed the findings of the evaluation and examination with Alejandra Castro.  I have also discussed any further diagnostic evaluation thatmay be needed or ordered today. Alejandra Castro verbalizes understanding of the findings of todays visit. We also reviewed her medications today and discussed drug interactions and side effects including but not limited  excessive drowsiness and altered mental states. We also discussed that there is always a risk not just to her but also people around her. she has been encouraged to call the office with any questions or concerns that should arise related to todays visit.  No orders of the defined types were placed in this encounter.    This patient was  seen by Shannan Harper, AGNP-C in collaboration with Dr. Devona Konig as a part of collaborative care agreement.    I have personally obtained a history, examined the patient, evaluated laboratory and imaging results, formulated the assessment and plan and placed orders.   Richelle Ito Saunders Glance, PhD, FAASM  Diplomate, American Board of Sleep Medicine    Allyne Gee, MD [facility] Diplomate ABMS Pulmonary and Critical Care Medicine Sleep medicine

## 2020-03-15 NOTE — Patient Instructions (Signed)

## 2020-03-16 DIAGNOSIS — G4733 Obstructive sleep apnea (adult) (pediatric): Secondary | ICD-10-CM | POA: Diagnosis not present

## 2020-03-19 DIAGNOSIS — E782 Mixed hyperlipidemia: Secondary | ICD-10-CM | POA: Diagnosis not present

## 2020-03-19 DIAGNOSIS — N951 Menopausal and female climacteric states: Secondary | ICD-10-CM | POA: Diagnosis not present

## 2020-03-19 DIAGNOSIS — I10 Essential (primary) hypertension: Secondary | ICD-10-CM | POA: Diagnosis not present

## 2020-03-23 DIAGNOSIS — N951 Menopausal and female climacteric states: Secondary | ICD-10-CM | POA: Diagnosis not present

## 2020-03-23 DIAGNOSIS — I10 Essential (primary) hypertension: Secondary | ICD-10-CM | POA: Diagnosis not present

## 2020-03-23 DIAGNOSIS — E782 Mixed hyperlipidemia: Secondary | ICD-10-CM | POA: Diagnosis not present

## 2020-03-23 DIAGNOSIS — R454 Irritability and anger: Secondary | ICD-10-CM | POA: Diagnosis not present

## 2020-03-23 DIAGNOSIS — N898 Other specified noninflammatory disorders of vagina: Secondary | ICD-10-CM | POA: Diagnosis not present

## 2020-03-30 DIAGNOSIS — I10 Essential (primary) hypertension: Secondary | ICD-10-CM | POA: Diagnosis not present

## 2020-03-30 DIAGNOSIS — N951 Menopausal and female climacteric states: Secondary | ICD-10-CM | POA: Diagnosis not present

## 2020-03-30 DIAGNOSIS — E782 Mixed hyperlipidemia: Secondary | ICD-10-CM | POA: Diagnosis not present

## 2020-04-07 DIAGNOSIS — F5101 Primary insomnia: Secondary | ICD-10-CM | POA: Diagnosis not present

## 2020-04-07 DIAGNOSIS — N951 Menopausal and female climacteric states: Secondary | ICD-10-CM | POA: Diagnosis not present

## 2020-04-07 DIAGNOSIS — I10 Essential (primary) hypertension: Secondary | ICD-10-CM | POA: Diagnosis not present

## 2020-04-07 DIAGNOSIS — E782 Mixed hyperlipidemia: Secondary | ICD-10-CM | POA: Diagnosis not present

## 2020-04-20 DIAGNOSIS — Z6830 Body mass index (BMI) 30.0-30.9, adult: Secondary | ICD-10-CM | POA: Diagnosis not present

## 2020-04-20 DIAGNOSIS — E782 Mixed hyperlipidemia: Secondary | ICD-10-CM | POA: Diagnosis not present

## 2020-05-04 DIAGNOSIS — I10 Essential (primary) hypertension: Secondary | ICD-10-CM | POA: Diagnosis not present

## 2020-05-04 DIAGNOSIS — Z6830 Body mass index (BMI) 30.0-30.9, adult: Secondary | ICD-10-CM | POA: Diagnosis not present

## 2020-05-25 DIAGNOSIS — N951 Menopausal and female climacteric states: Secondary | ICD-10-CM | POA: Diagnosis not present

## 2020-05-25 DIAGNOSIS — R232 Flushing: Secondary | ICD-10-CM | POA: Diagnosis not present

## 2020-05-25 DIAGNOSIS — Z6830 Body mass index (BMI) 30.0-30.9, adult: Secondary | ICD-10-CM | POA: Diagnosis not present

## 2020-05-25 DIAGNOSIS — F5101 Primary insomnia: Secondary | ICD-10-CM | POA: Diagnosis not present

## 2020-06-30 DIAGNOSIS — Z0000 Encounter for general adult medical examination without abnormal findings: Secondary | ICD-10-CM | POA: Diagnosis not present

## 2020-06-30 DIAGNOSIS — Z79899 Other long term (current) drug therapy: Secondary | ICD-10-CM | POA: Diagnosis not present

## 2020-06-30 DIAGNOSIS — I10 Essential (primary) hypertension: Secondary | ICD-10-CM | POA: Diagnosis not present

## 2020-06-30 DIAGNOSIS — E782 Mixed hyperlipidemia: Secondary | ICD-10-CM | POA: Diagnosis not present

## 2020-06-30 DIAGNOSIS — G4733 Obstructive sleep apnea (adult) (pediatric): Secondary | ICD-10-CM | POA: Diagnosis not present

## 2020-09-15 DIAGNOSIS — H5213 Myopia, bilateral: Secondary | ICD-10-CM | POA: Diagnosis not present

## 2020-09-29 DIAGNOSIS — G4733 Obstructive sleep apnea (adult) (pediatric): Secondary | ICD-10-CM | POA: Diagnosis not present

## 2021-01-04 DIAGNOSIS — N898 Other specified noninflammatory disorders of vagina: Secondary | ICD-10-CM | POA: Insufficient documentation

## 2021-01-04 DIAGNOSIS — Z23 Encounter for immunization: Secondary | ICD-10-CM | POA: Diagnosis not present

## 2021-01-04 DIAGNOSIS — G4733 Obstructive sleep apnea (adult) (pediatric): Secondary | ICD-10-CM | POA: Diagnosis not present

## 2021-01-04 DIAGNOSIS — F5104 Psychophysiologic insomnia: Secondary | ICD-10-CM | POA: Diagnosis not present

## 2021-01-04 DIAGNOSIS — I10 Essential (primary) hypertension: Secondary | ICD-10-CM | POA: Diagnosis not present

## 2021-02-18 IMAGING — US US RENAL ARTERY STENOSIS
1 series · 13 of 25 positions shown · non-contrast
Comparison: None.

CLINICAL DATA: 63-year-old female with hypertension

EXAM:
RENAL/URINARY TRACT ULTRASOUND
RENAL DUPLEX DOPPLER ULTRASOUND

[Series 1: us renal artery stenosis · 0.26mm/px · 13 of 56 slices shown]
[im 1/56]
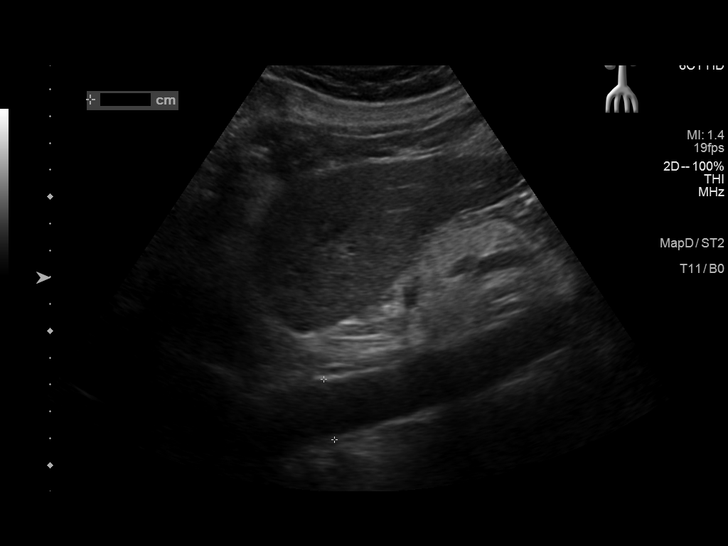
[im 5/56]
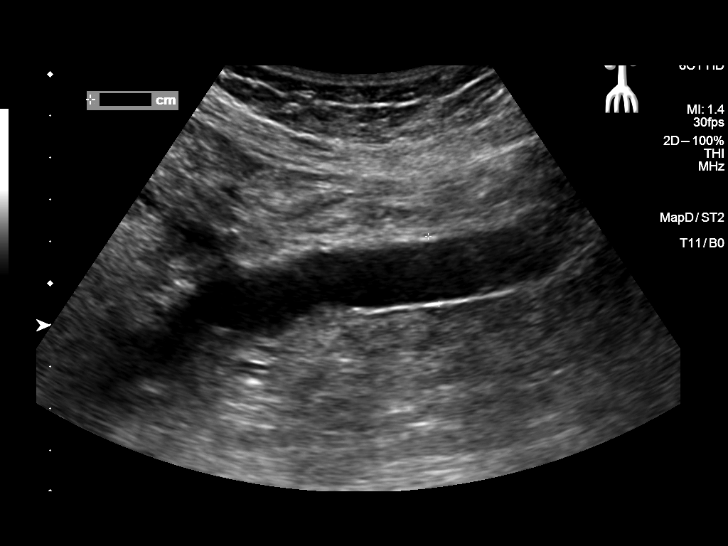
[im 10/56]
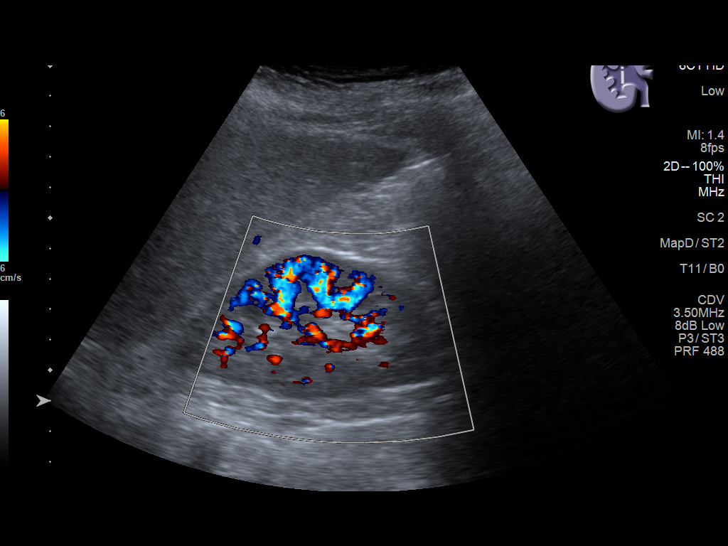
[im 14/56]
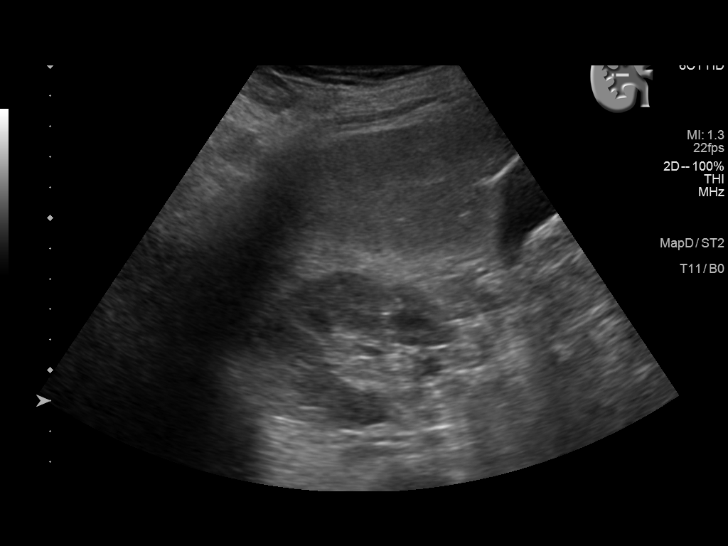
[im 19/56]
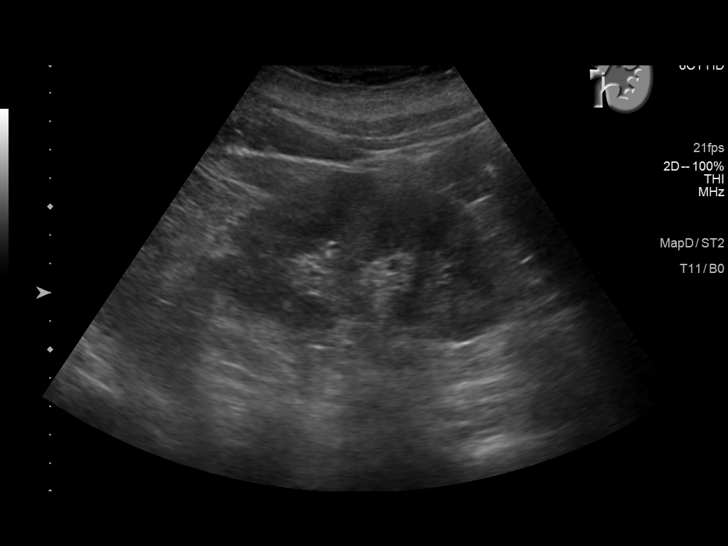
[im 23/56]
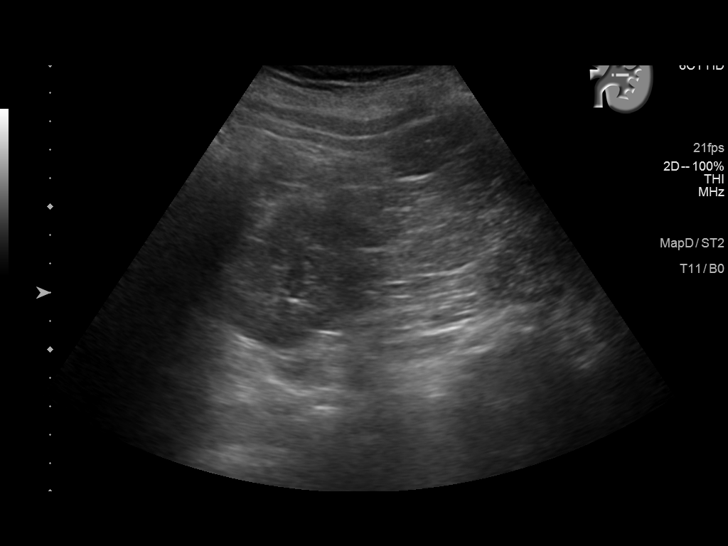
[im 28/56]
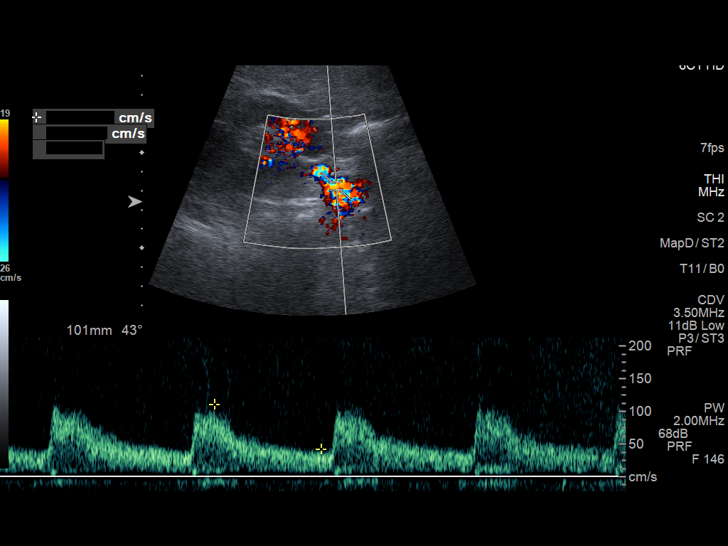
[im 33/56]
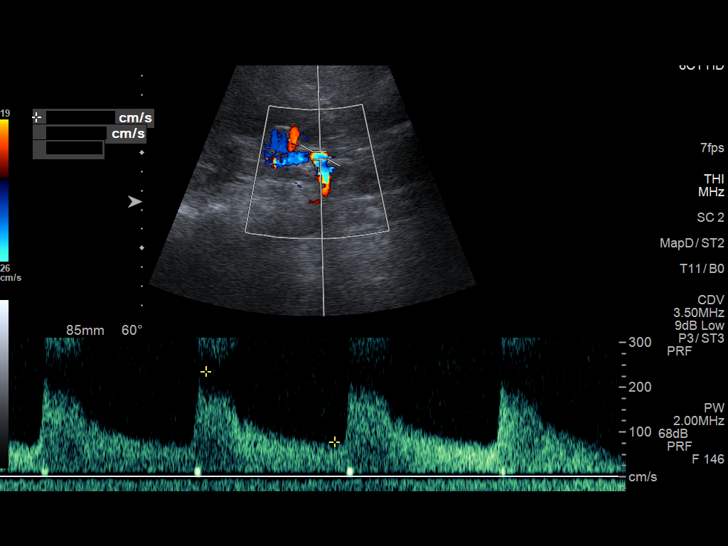
[im 37/56]
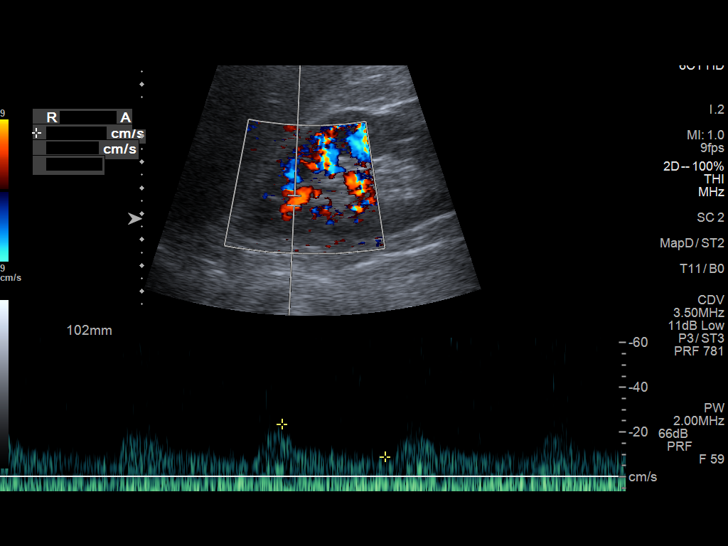
[im 42/56]
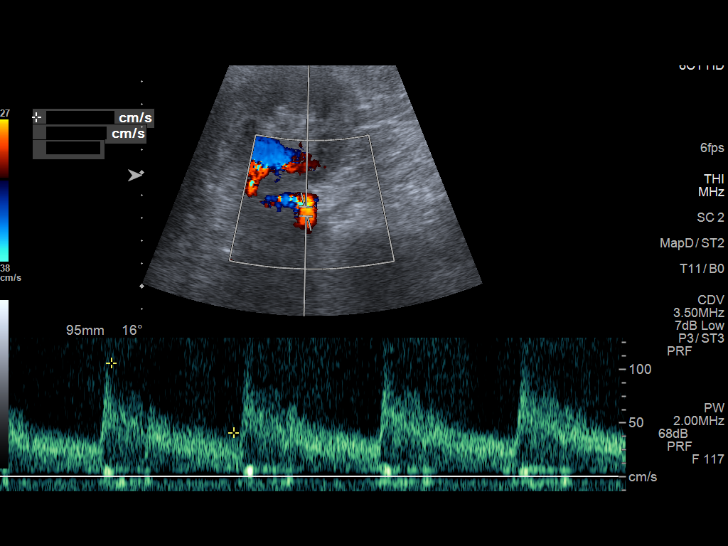
[im 46/56]
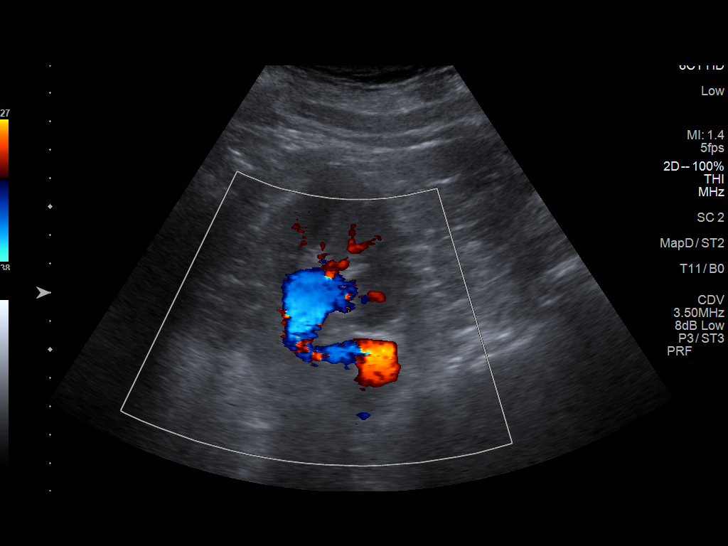
[im 51/56]
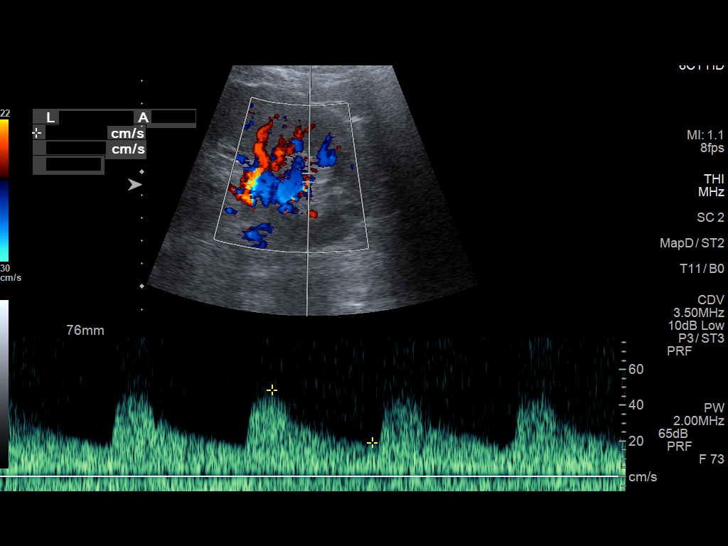
[im 56/56]
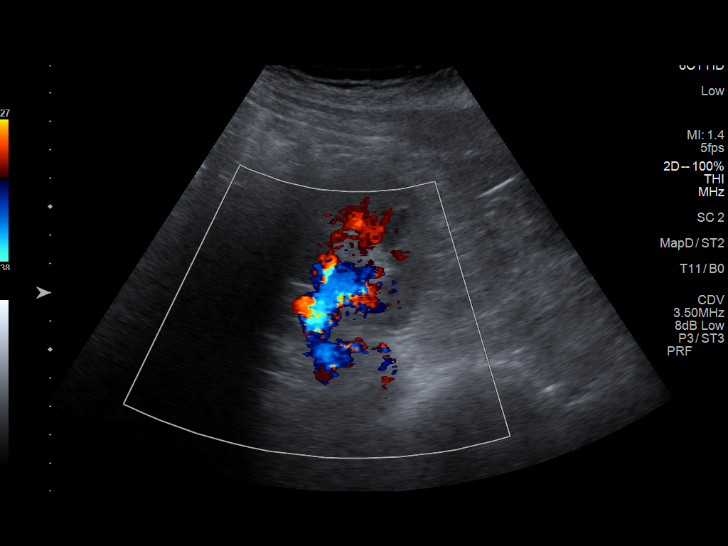

[13 of 25 positions shown; findings below may reference images not displayed]

FINDINGS: Right Kidney:

Length: 10.6 cm. Echogenic renal parenchyma with increased
conspicuity of the corticomedullary interface. No hydronephrosis,
nephrolithiasis or enhancing renal mass.

Left Kidney:

Length: 12.0 cm. Echogenic renal parenchyma with increased
conspicuity of the corticomedullary interface. No hydronephrosis,
nephrolithiasis or enhancing renal mass.

Bladder:  Within normal limits

RENAL DUPLEX ULTRASOUND

Right Renal Artery Velocities:

Origin:  111 cm/sec

Mid:  117 cm/sec

Hilum:  235 cm/sec

Interlobar:  33 cm/sec

Arcuate:  34 cm/sec

Left Renal Artery Velocities:

Origin:  118 cm/sec

Mid:  215 cm/sec

Hilum:  96 cm/sec

Interlobar:  50 cm/sec

Arcuate:  34 cm/sec

Aortic Velocity:  98 cm/sec

Right Renal-Aortic Ratios:

Origin:

Mid:

Hilum:

Interlobar:

Arcuate:

Left Renal-Aortic Ratios:

Origin:

Mid:

Hilum:

Interlobar:

Arcuate:

Other: Of note, the bilateral renal arteries are tortuous.
IMPRESSION: 1. Areas of elevated peak systolic velocity in the right renal hilum
and left mid renal artery are favored to be spurious and secondary
to underlying vessel tortuosity. However, underlying atherosclerotic
stenosis or fibromuscular dysplasia is difficult to exclude
entirely. If further imaging confirmation is desired, consider
further evaluation with MRA or CTA of the abdomen.
2. Echogenic kidneys bilaterally consistent with underlying medical
renal disease.

## 2021-02-22 DIAGNOSIS — L578 Other skin changes due to chronic exposure to nonionizing radiation: Secondary | ICD-10-CM | POA: Diagnosis not present

## 2021-02-22 DIAGNOSIS — D485 Neoplasm of uncertain behavior of skin: Secondary | ICD-10-CM | POA: Diagnosis not present

## 2021-02-22 DIAGNOSIS — Z86018 Personal history of other benign neoplasm: Secondary | ICD-10-CM | POA: Diagnosis not present

## 2021-02-22 DIAGNOSIS — D2262 Melanocytic nevi of left upper limb, including shoulder: Secondary | ICD-10-CM | POA: Diagnosis not present

## 2021-03-14 ENCOUNTER — Ambulatory Visit (INDEPENDENT_AMBULATORY_CARE_PROVIDER_SITE_OTHER): Payer: Medicare Other | Admitting: Internal Medicine

## 2021-03-14 ENCOUNTER — Other Ambulatory Visit: Payer: Self-pay

## 2021-03-14 VITALS — BP 162/93 | HR 69 | Resp 18 | Ht 66.0 in | Wt 194.2 lb

## 2021-03-14 DIAGNOSIS — Z7189 Other specified counseling: Secondary | ICD-10-CM

## 2021-03-14 DIAGNOSIS — E782 Mixed hyperlipidemia: Secondary | ICD-10-CM | POA: Insufficient documentation

## 2021-03-14 DIAGNOSIS — G4700 Insomnia, unspecified: Secondary | ICD-10-CM | POA: Diagnosis not present

## 2021-03-14 DIAGNOSIS — G4733 Obstructive sleep apnea (adult) (pediatric): Secondary | ICD-10-CM | POA: Diagnosis not present

## 2021-03-14 DIAGNOSIS — Z9989 Dependence on other enabling machines and devices: Secondary | ICD-10-CM

## 2021-03-14 DIAGNOSIS — I10 Essential (primary) hypertension: Secondary | ICD-10-CM | POA: Diagnosis not present

## 2021-03-14 DIAGNOSIS — F32A Depression, unspecified: Secondary | ICD-10-CM | POA: Insufficient documentation

## 2021-03-14 NOTE — Progress Notes (Signed)
[facility] [place], [place] [address]  Pulmonary Sleep Medicine   Office Visit Note  Patient Name: Alejandra Castro DOB: 02-01-1956 MRN [id number]    Chief Complaint: Obstructive Sleep Apnea visit  Brief History:  Alejandra Castro is seen today for annual follow up The patient has a 4 year history of sleep apnea. Patient is using PAP nightly.  The patient feels not so good after sleeping with PAP.  The patient reports not benefiting, but feels she needs it from PAP use. Reported sleepiness is  improved and the Epworth Sleepiness Score is 1 out of 24. The patient does not take naps. The patient complains of the following: she feels like she can't shut her brain off so most night she sleeps off and on in 1 - 1.5 hour intervals  The compliance download shows  compliance with an average use time of 10:30 hours@ 100%.. The AHI is 2.6  The patient does not complain of limb movements disrupting sleep.  ROS  General: (-) fever, (-) chills, (-) night sweat Nose and Sinuses: (-) nasal stuffiness or itchiness, (-) postnasal drip, (-) nosebleeds, (-) sinus trouble. Mouth and Throat: (-) sore throat, (-) hoarseness. Neck: (-) swollen glands, (-) enlarged thyroid, (-) neck pain. Respiratory: - cough, - shortness of breath, - wheezing. Neurologic: - numbness, - tingling. Psychiatric: + anxiety, + depression   Current Medication: Outpatient Encounter Medications as of 03/14/2021  Medication Sig   eszopiclone (LUNESTA) 2 MG TABS tablet Take by mouth.   lisinopril (ZESTRIL) 40 MG tablet Take by mouth.   meclizine (ANTIVERT) 25 MG tablet Take by mouth.   metoprolol succinate (TOPROL-XL) 100 MG 24 hr tablet Take by mouth.   simvastatin (ZOCOR) 20 MG tablet Take by mouth.   [DISCONTINUED] FLUoxetine (PROZAC) 20 MG capsule Take 20-40 mg by mouth every morning. USUALLY JUST TAKES 1 BUT WILL TAKE 2 IF NEEDED    albuterol (VENTOLIN HFA) 108 (90 Base) MCG/ACT inhaler Inhale into the  lungs.   amLODipine (NORVASC) 5 MG tablet Take 1 tablet (5 mg total) by mouth daily.   aspirin EC 81 MG tablet Take 81 mg by mouth daily.   Calcium Carb-Cholecalciferol 600-10 MG-MCG TABS Take by mouth.   eszopiclone (LUNESTA) 2 MG TABS tablet Take by mouth.   fluticasone (FLONASE) 50 MCG/ACT nasal spray Place into the nose.   lisinopril (ZESTRIL) 40 MG tablet SMARTSIG:1 Tablet(s) By Mouth Daily   magnesium oxide (MAG-OX) 400 MG tablet Take by mouth.   meclizine (ANTIVERT) 25 MG tablet Take by mouth.   metoprolol succinate (TOPROL-XL) 100 MG 24 hr tablet Take by mouth.   Multiple Vitamin (MULTIVITAMIN WITH MINERALS) TABS tablet Take 1 tablet by mouth daily.   Omega-3 Fatty Acids (FISH OIL PO) Take 2 capsules by mouth daily.   simvastatin (ZOCOR) 20 MG tablet Take 20 mg by mouth daily at 6 PM.    [DISCONTINUED] felodipine (PLENDIL) 5 MG 24 hr tablet Take 5 mg by mouth at bedtime.    [DISCONTINUED] zolpidem (AMBIEN) 5 MG tablet TAKE 1 TABLET BY MOUTH NIGHTLY AS NEEDED FOR SLEEP. MAXIMUM OF 4 TIMES PER WEEK   No facility-administered encounter medications on file as of 03/14/2021.    Surgical History: Past Surgical History:  Procedure Laterality Date   COLONOSCOPY      Medical History: Past Medical History:  Diagnosis Date   Arthritis    Hyperlipidemia    Hypertension     Family History: Non contributory to the present  illness  Social History: Social History   Socioeconomic History   Marital status: Widowed    Spouse name: Not on file   Number of children: Not on file   Years of education: Not on file   Highest education level: Not on file  Occupational History   Not on file  Tobacco Use   Smoking status: Never   Smokeless tobacco: Never  Vaping Use   Vaping Use: Never used  Substance and Sexual Activity   Alcohol use: Yes    Alcohol/week: 14.0 standard drinks    Types: 14 Cans of beer per week   Drug use: No   Sexual activity: Not on file  Other Topics Concern    Not on file  Social History Narrative   Not on file   Social Determinants of Health   Financial Resource Strain: Not on file  Food Insecurity: Not on file  Transportation Needs: Not on file  Physical Activity: Not on file  Stress: Not on file  Social Connections: Not on file  Intimate Partner Violence: Not on file    Vital Signs: Blood pressure (!) 162/93, pulse 69, resp. rate 18, height 5\' 6"  (1.676 m), weight 194 lb 3.2 oz (88.1 kg), SpO2 98 %. Body mass index is 31.34 kg/m.    Examination: General Appearance: The patient is well-developed, well-nourished, and in no distress. Neck Circumference: 39 Skin: Gross inspection of skin unremarkable. Head: normocephalic, no gross deformities. Eyes: no gross deformities noted. ENT: ears appear grossly normal Neurologic: Alert and oriented. No involuntary movements.    EPWORTH SLEEPINESS SCALE:  Scale:  (0)= no chance of dozing; (1)= slight chance of dozing; (2)= moderate chance of dozing; (3)= high chance of dozing  Chance  Situtation   0 Sitting and reading: 0    Watching TV: 0    Sitting Inactive in public: 0    As a passenger in car: 1      Lying down to rest: 0    Sitting and talking: 0    Sitting quielty after lunch: 0    In a car, stopped in traffic: 0   TOTAL SCORE:   1 out of 24    SLEEP STUDIES:  Split 02/23/17 - AHI  108, Low SpO2 85%   CPAP COMPLIANCE DATA:  Date Range: 03/10/20 - 03/09/21  Average Daily Use: 10:30 hours  Median Use: 10:36 hours  Compliance for > 4 Hours: 100% days  AHI: 2.6 respiratory events per hour  Days Used: 364/365  Mask Leak: 16.1 lpm  95th Percentile Pressure: 8 cmH2O         LABS: No results found for this or any previous visit (from the past 2160 hour(s)).  Radiology: MM 3D SCREEN BREAST BILATERAL  Result Date: 02/18/2020 CLINICAL DATA:  Screening. EXAM: DIGITAL SCREENING BILATERAL MAMMOGRAM WITH TOMO AND CAD COMPARISON:  Previous  exam(s). ACR Breast Density Category b: There are scattered areas of fibroglandular density. FINDINGS: There are no findings suspicious for malignancy. Images were processed with CAD. IMPRESSION: No mammographic evidence of malignancy. A result letter of this screening mammogram will be mailed directly to the patient. RECOMMENDATION: Screening mammogram in one year. (Code:SM-B-01Y) BI-RADS CATEGORY  1: Negative. Electronically Signed   By: Audie Pinto M.D.   On: 02/18/2020 13:39    No results found.  No results found.    Assessment and Plan: Patient Active Problem List   Diagnosis Date Noted   Depression 03/14/2021   Mixed hyperlipidemia 03/14/2021  Insomnia 03/14/2021   CPAP use counseling 03/15/2020   Overweight (BMI 25.0-29.9) 03/15/2020   Anxiety 06/27/2017   OSA on CPAP 02/23/2017   Benign essential hypertension 12/10/2013   Asthma without status asthmaticus 12/10/2013   Essential hypertension 12/10/2013    1. OSA on CPAP The patient does tolerate PAP and reports  benefit from PAP use. The patient was reminded how to clean equipment and advised to replace supplies routinely. The patient was also counselled on sleep hygiene. . The compliance is excellent. The AHI is 2.6.   OSA- continue with excellent compliance with pap. F/u one year.   General Counseling: I hav  2. CPAP use counseling CPAP Counseling: had a lengthy discussion with the patient regarding the importance of PAP therapy in management of the sleep apnea. Patient appears to understand the risk factor reduction and also understands the risks associated with untreated sleep apnea. Patient will try to make a good faith effort to remain compliant with therapy. Also instructed the patient on proper cleaning of the device including the water must be changed daily if possible and use of distilled water is preferred. Patient understands that the machine should be regularly cleaned with appropriate recommended cleaning  solutions that do not damage the PAP machine for example given white vinegar and water rinses. Other methods such as ozone treatment may not be as good as these simple methods to achieve cleaning.   3. Benign essential hypertension Hypertension Counseling:   The following hypertensive lifestyle modification were recommended and discussed:  1. Limiting alcohol intake to less than 1 oz/day of ethanol:(24 oz of beer or 8 oz of wine or 2 oz of 100-proof whiskey). 2. Take baby ASA 81 mg daily. 3. Importance of regular aerobic exercise and losing weight. 4. Reduce dietary saturated fat and cholesterol intake for overall cardiovascular health. 5. Maintaining adequate dietary potassium, calcium, and magnesium intake. 6. Regular monitoring of the blood pressure. 7. Reduce sodium intake to less than 100 mmol/day (less than 2.3 gm of sodium or less than 6 gm of sodium choride)    4. Insomnia, unspecified type Pt has had this problem for many years. She admits to rumination. She is using sleep aids. We discussed sleep hygiene and cbti. I have recommended she try cbti and discuss depression and anxiety with her doctors.    General Counseling: I have discussed the findings of the evaluation and examination with Margaretha Sheffield.  I have also discussed any further diagnostic evaluation thatmay be needed or ordered today. Jodilyn verbalizes understanding of the findings of todays visit. We also reviewed her medications today and discussed drug interactions and side effects including but not limited excessive drowsiness and altered mental states. We also discussed that there is always a risk not just to her but also people around her. she has been encouraged to call the office with any questions or concerns that should arise related to todays visit.  No orders of the defined types were placed in this encounter.       I have personally obtained a history, examined the patient, evaluated laboratory and imaging results,  formulated the assessment and plan and placed orders. This patient was seen today by Tressie Ellis, PA-C in collaboration with Dr. Devona Konig.   Allyne Gee, MD [facility] Diplomate ABMS Pulmonary Critical Care Medicine and Sleep Medicine

## 2021-03-14 NOTE — Patient Instructions (Signed)

## 2021-06-22 DIAGNOSIS — G4733 Obstructive sleep apnea (adult) (pediatric): Secondary | ICD-10-CM | POA: Diagnosis not present

## 2021-07-08 ENCOUNTER — Ambulatory Visit: Payer: Medicare Other | Admitting: Nurse Practitioner

## 2021-08-03 ENCOUNTER — Ambulatory Visit (INDEPENDENT_AMBULATORY_CARE_PROVIDER_SITE_OTHER): Payer: Medicare Other | Admitting: Internal Medicine

## 2021-08-03 ENCOUNTER — Encounter: Payer: Self-pay | Admitting: Internal Medicine

## 2021-08-03 VITALS — BP 136/77 | HR 61 | Ht 66.0 in | Wt 189.7 lb

## 2021-08-03 DIAGNOSIS — Z7189 Other specified counseling: Secondary | ICD-10-CM | POA: Diagnosis not present

## 2021-08-03 DIAGNOSIS — Z9989 Dependence on other enabling machines and devices: Secondary | ICD-10-CM

## 2021-08-03 DIAGNOSIS — E663 Overweight: Secondary | ICD-10-CM

## 2021-08-03 DIAGNOSIS — G4733 Obstructive sleep apnea (adult) (pediatric): Secondary | ICD-10-CM | POA: Diagnosis not present

## 2021-08-03 DIAGNOSIS — H52223 Regular astigmatism, bilateral: Secondary | ICD-10-CM | POA: Diagnosis not present

## 2021-08-03 DIAGNOSIS — G4700 Insomnia, unspecified: Secondary | ICD-10-CM

## 2021-08-03 DIAGNOSIS — F419 Anxiety disorder, unspecified: Secondary | ICD-10-CM

## 2021-08-03 DIAGNOSIS — I10 Essential (primary) hypertension: Secondary | ICD-10-CM | POA: Diagnosis not present

## 2021-08-03 DIAGNOSIS — E782 Mixed hyperlipidemia: Secondary | ICD-10-CM

## 2021-08-03 NOTE — Assessment & Plan Note (Signed)
Patient was advised to use melatonin walk every day ?

## 2021-08-03 NOTE — Assessment & Plan Note (Signed)
Hypercholesterolemia  I advised the patient to follow Mediterranean diet This diet is rich in fruits vegetables and whole grain, and This diet is also rich in fish and lean meat Patient should also eat a handful of almonds or walnuts daily Recent heart study indicated that average follow-up on this kind of diet reduces the cardiovascular mortality by 50 to 70%== 

## 2021-08-03 NOTE — Progress Notes (Signed)
? ?New Patient Office Visit ? ?Subjective:  ?Patient ID: Alejandra Castro, female    DOB: Nov 26, 1955  Age: 66 y.o. MRN: [id number] ? ?CC:  ?Chief Complaint  ?Patient presents with  ? New Patient (Initial Visit)  ? ? ?HPI ?Patient presents for establish care ? ?Past Medical History:  ?Diagnosis Date  ? Arthritis   ? Hyperlipidemia   ? Hypertension   ? ? ? ?Current Outpatient Medications:  ?  albuterol (VENTOLIN HFA) 108 (90 Base) MCG/ACT inhaler, Inhale into the lungs., Disp: , Rfl:  ?  amLODipine (NORVASC) 5 MG tablet, Take 1 tablet (5 mg total) by mouth daily., Disp: 90 tablet, Rfl: 0 ?  aspirin EC 81 MG tablet, Take 81 mg by mouth daily., Disp: , Rfl:  ?  Calcium Carb-Cholecalciferol 600-10 MG-MCG TABS, Take by mouth., Disp: , Rfl:  ?  fluticasone (FLONASE) 50 MCG/ACT nasal spray, Place into the nose., Disp: , Rfl:  ?  lisinopril (ZESTRIL) 40 MG tablet, Take by mouth., Disp: , Rfl:  ?  magnesium oxide (MAG-OX) 400 MG tablet, Take by mouth., Disp: , Rfl:  ?  meclizine (ANTIVERT) 25 MG tablet, Take by mouth., Disp: , Rfl:  ?  metoprolol succinate (TOPROL-XL) 100 MG 24 hr tablet, Take by mouth., Disp: , Rfl:  ?  Multiple Vitamin (MULTIVITAMIN WITH MINERALS) TABS tablet, Take 1 tablet by mouth daily., Disp: , Rfl:  ?  Omega-3 Fatty Acids (FISH OIL PO), Take 2 capsules by mouth daily., Disp: , Rfl:  ?  simvastatin (ZOCOR) 20 MG tablet, Take 20 mg by mouth daily at 6 PM. , Disp: , Rfl:   ? ?Past Surgical History:  ?Procedure Laterality Date  ? COLONOSCOPY    ? ? ?Family History  ?Problem Relation Age of Onset  ? Breast cancer Neg Hx   ? ? ?Social History  ? ?Socioeconomic History  ? Marital status: Widowed  ?  Spouse name: Not on file  ? Number of children: Not on file  ? Years of education: Not on file  ? Highest education level: Not on file  ?Occupational History  ? Not on file  ?Tobacco Use  ? Smoking status: Never  ? Smokeless tobacco: Never  ?Vaping Use  ? Vaping Use: Never used  ?Substance and Sexual Activity  ?  Alcohol use: Yes  ?  Alcohol/week: 14.0 standard drinks  ?  Types: 14 Cans of beer per week  ? Drug use: No  ? Sexual activity: Not on file  ?Other Topics Concern  ? Not on file  ?Social History Narrative  ? Not on file  ? ?Social Determinants of Health  ? ?Financial Resource Strain: Not on file  ?Food Insecurity: Not on file  ?Transportation Needs: Not on file  ?Physical Activity: Not on file  ?Stress: Not on file  ?Social Connections: Not on file  ?Intimate Partner Violence: Not on file  ? ? ?ROS ?Review of Systems  ?Constitutional: Negative.   ?HENT: Negative.    ?Eyes: Negative.   ?Respiratory: Negative.    ?Cardiovascular: Negative.   ?Gastrointestinal: Negative.   ?Endocrine: Negative.   ?Genitourinary: Negative.   ?Musculoskeletal: Negative.   ?Skin: Negative.   ?Allergic/Immunologic: Negative.   ?Neurological: Negative.   ?Hematological: Negative.   ?Psychiatric/Behavioral: Negative.    ?All other systems reviewed and are negative. ? ?Objective:  ? ?Today's Vitals: BP 136/77   Pulse 61   Ht '5\' 6"'$  (1.676 m)   Wt 189 lb 11.2 oz (86 kg)  BMI 30.62 kg/m?  ? ?Physical Exam ?Vitals reviewed.  ?Constitutional:   ?   Appearance: She is obese.  ?HENT:  ?   Head: Normocephalic.  ?   Nose: Nose normal.  ?   Mouth/Throat:  ?   Mouth: Mucous membranes are moist.  ?Cardiovascular:  ?   Rate and Rhythm: Normal rate.  ?Pulmonary:  ?   Effort: Pulmonary effort is normal.  ?Abdominal:  ?   Palpations: Abdomen is soft.  ?Musculoskeletal:     ?   General: Normal range of motion.  ?Psychiatric:     ?   Mood and Affect: Mood normal.  ? ? ?Assessment & Plan:  ? ?Problem List Items Addressed This Visit   ? ?  ? Cardiovascular and Mediastinum  ? Essential hypertension - Primary  ?   Patient denies any chest pain or shortness of breath there is no history of palpitation or paroxysmal nocturnal dyspnea ?  patient was advised to follow low-salt low-cholesterol diet ? ?  ideally I want to keep systolic blood pressure below 130  mmHg, patient was asked to check blood pressure one times a week and give me a report on that.  Patient will be follow-up in 3 months  or earlier as needed, patient will call me back for any change in the cardiovascular symptoms ?Patient was advised to buy a book from local bookstore concerning blood pressure and read several chapters  every day.  This will be supplemented by some of the material we will give him from the office.  Patient should also utilize other resources like YouTube and Internet to learn more about the blood pressure and the diet. ? ?  ?  ?  ? Respiratory  ? OSA on CPAP  ?  Patient is using the CPAP machine ? ?  ?  ?  ? Other  ? CPAP use counseling  ?  Patient was advised to use CPAP on a regular basis ? ?  ?  ? Overweight (BMI 25.0-29.9)  ?  - I encouraged the patient to lose weight.  ?- I educated them on making healthy dietary choices including eating more fruits and vegetables and less fried foods. ?- I encouraged the patient to exercise more, and educated on the benefits of exercise including weight loss, diabetes prevention, and hypertension prevention.  ? Dietary counseling with a registered dietician ? Referral to a weight management support group (e.g. Weight Watchers, Overeaters Anonymous) ?? If your BMI is greater than 29 or you have gained more than 15 pounds you should work on weight loss. ?? Attend a healthy cooking class  ? ?  ?  ? Anxiety  ?  - Patient experiencing high levels of anxiety.  ?- Encouraged patient to engage in relaxing activities like yoga, meditation, journaling, going for a walk, or participating in a hobby.  ?- Encouraged patient to reach out to trusted friends or family members about recent struggles, ?Patient was advised to read A book, how to stop worrying and start living, it is good book to read to control  the stress ? ? ?  ?  ? Mixed hyperlipidemia  ?  Hypercholesterolemia ? ?I advised the patient to follow Mediterranean diet ?This diet is rich in fruits  vegetables and whole grain, and ?This diet is also rich in fish and lean meat ?Patient should also eat a handful of almonds or walnuts daily ?Recent heart study indicated that average follow-up on this kind of diet reduces the cardiovascular  mortality by 50 to 70%== ? ?  ?  ? Insomnia  ?  Patient was advised to use melatonin walk every day ? ?  ?  ? ? ?Outpatient Encounter Medications as of 08/03/2021  ?Medication Sig  ? albuterol (VENTOLIN HFA) 108 (90 Base) MCG/ACT inhaler Inhale into the lungs.  ? amLODipine (NORVASC) 5 MG tablet Take 1 tablet (5 mg total) by mouth daily.  ? aspirin EC 81 MG tablet Take 81 mg by mouth daily.  ? Calcium Carb-Cholecalciferol 600-10 MG-MCG TABS Take by mouth.  ? fluticasone (FLONASE) 50 MCG/ACT nasal spray Place into the nose.  ? lisinopril (ZESTRIL) 40 MG tablet Take by mouth.  ? magnesium oxide (MAG-OX) 400 MG tablet Take by mouth.  ? meclizine (ANTIVERT) 25 MG tablet Take by mouth.  ? metoprolol succinate (TOPROL-XL) 100 MG 24 hr tablet Take by mouth.  ? Multiple Vitamin (MULTIVITAMIN WITH MINERALS) TABS tablet Take 1 tablet by mouth daily.  ? Omega-3 Fatty Acids (FISH OIL PO) Take 2 capsules by mouth daily.  ? simvastatin (ZOCOR) 20 MG tablet Take 20 mg by mouth daily at 6 PM.   ? [DISCONTINUED] eszopiclone (LUNESTA) 2 MG TABS tablet Take by mouth.  ? [DISCONTINUED] eszopiclone (LUNESTA) 2 MG TABS tablet Take by mouth.  ? [DISCONTINUED] felodipine (PLENDIL) 5 MG 24 hr tablet Take 5 mg by mouth at bedtime.   ? [DISCONTINUED] FLUoxetine (PROZAC) 20 MG capsule Take 20-40 mg by mouth every morning. USUALLY JUST TAKES 1 BUT WILL TAKE 2 IF NEEDED   ? [DISCONTINUED] lisinopril (ZESTRIL) 40 MG tablet SMARTSIG:1 Tablet(s) By Mouth Daily  ? [DISCONTINUED] meclizine (ANTIVERT) 25 MG tablet Take by mouth.  ? [DISCONTINUED] metoprolol succinate (TOPROL-XL) 100 MG 24 hr tablet Take by mouth.  ? [DISCONTINUED] simvastatin (ZOCOR) 20 MG tablet Take by mouth.  ? ?No facility-administered  encounter medications on file as of 08/03/2021.  ? ? ?Follow-up: No follow-ups on file.  ? ?Cletis Athens, MD ?

## 2021-08-03 NOTE — Assessment & Plan Note (Signed)
Patient is using the CPAP machine 

## 2021-08-03 NOTE — Assessment & Plan Note (Signed)
Patient was advised to use CPAP on a regular basis ?

## 2021-08-03 NOTE — Assessment & Plan Note (Signed)

## 2021-08-03 NOTE — Assessment & Plan Note (Signed)
-   Patient experiencing high levels of anxiety.  - Encouraged patient to engage in relaxing activities like yoga, meditation, journaling, going for a walk, or participating in a hobby.  - Encouraged patient to reach out to trusted friends or family members about recent struggles, Patient was advised to read A book, how to stop worrying and start living, it is good book to read to control  the stress  

## 2021-08-03 NOTE — Assessment & Plan Note (Signed)

## 2021-08-16 ENCOUNTER — Ambulatory Visit: Payer: Medicare Other | Admitting: Internal Medicine

## 2021-08-18 MED ORDER — AMLODIPINE BESYLATE 5 MG PO TABS: 5.0000 mg | ORAL_TABLET | Freq: Every day | ORAL | 1 refills | Status: DC

## 2021-08-18 NOTE — Addendum Note (Signed)
Addended by: Alois Cliche on: 08/18/2021 03:33 PM ? ? Modules accepted: Orders ? ?

## 2021-08-22 ENCOUNTER — Ambulatory Visit: Payer: Medicare Other

## 2021-08-22 DIAGNOSIS — E782 Mixed hyperlipidemia: Secondary | ICD-10-CM

## 2021-08-22 DIAGNOSIS — Z0000 Encounter for general adult medical examination without abnormal findings: Secondary | ICD-10-CM | POA: Diagnosis not present

## 2021-08-22 DIAGNOSIS — I10 Essential (primary) hypertension: Secondary | ICD-10-CM

## 2021-08-23 LAB — COMPLETE METABOLIC PANEL WITH GFR
AG Ratio: 1.6 (calc) (ref 1.0–2.5)
ALT: 12 U/L (ref 6–29)
AST: 13 U/L (ref 10–35)
Albumin: 4.1 g/dL (ref 3.6–5.1)
Alkaline phosphatase (APISO): 79 U/L (ref 37–153)
BUN: 13 mg/dL (ref 7–25)
CO2: 22 mmol/L (ref 20–32)
Calcium: 9.1 mg/dL (ref 8.6–10.4)
Chloride: 99 mmol/L (ref 98–110)
Creat: 0.58 mg/dL (ref 0.50–1.05)
Globulin: 2.5 g/dL (calc) (ref 1.9–3.7)
Glucose, Bld: 106 mg/dL — ABNORMAL HIGH (ref 65–99)
Potassium: 4.3 mmol/L (ref 3.5–5.3)
Sodium: 131 mmol/L — ABNORMAL LOW (ref 135–146)
Total Bilirubin: 0.8 mg/dL (ref 0.2–1.2)
Total Protein: 6.6 g/dL (ref 6.1–8.1)
eGFR: 99 mL/min/{1.73_m2} (ref >=60)

## 2021-08-23 LAB — CBC WITH DIFFERENTIAL/PLATELET
Absolute Monocytes: 1012 cells/uL — ABNORMAL HIGH (ref 200–950)
Basophils Absolute: 35 cells/uL (ref 0–200)
Basophils Relative: 0.3 %
Eosinophils Absolute: 58 cells/uL (ref 15–500)
Eosinophils Relative: 0.5 %
HCT: 36.8 % (ref 35.0–45.0)
Hemoglobin: 12.3 g/dL (ref 11.7–15.5)
Lymphs Abs: 851 cells/uL (ref 850–3900)
MCH: 30.8 pg (ref 27.0–33.0)
MCHC: 33.4 g/dL (ref 32.0–36.0)
MCV: 92.2 fL (ref 80.0–100.0)
MPV: 10.5 fL (ref 7.5–12.5)
Monocytes Relative: 8.8 %
Neutro Abs: 9545 cells/uL — ABNORMAL HIGH (ref 1500–7800)
Neutrophils Relative %: 83 %
Platelets: 285 10*3/uL (ref 140–400)
RBC: 3.99 10*6/uL (ref 3.80–5.10)
RDW: 13.2 % (ref 11.0–15.0)
Total Lymphocyte: 7.4 %
WBC: 11.5 10*3/uL — ABNORMAL HIGH (ref 3.8–10.8)

## 2021-08-23 LAB — LIPID PANEL
Cholesterol: 134 mg/dL (ref <200)
HDL: 69 mg/dL (ref >=50)
LDL Cholesterol (Calc): 50 mg/dL (calc)
Non-HDL Cholesterol (Calc): 65 mg/dL (calc) (ref <130)
Total CHOL/HDL Ratio: 1.9 (calc) (ref <5.0)
Triglycerides: 73 mg/dL (ref <150)

## 2021-08-23 LAB — TSH: TSH: 4.95 mIU/L — ABNORMAL HIGH (ref 0.40–4.50)

## 2021-08-24 ENCOUNTER — Encounter: Payer: Self-pay | Admitting: Internal Medicine

## 2021-08-24 ENCOUNTER — Ambulatory Visit (INDEPENDENT_AMBULATORY_CARE_PROVIDER_SITE_OTHER): Payer: Medicare Other | Admitting: Internal Medicine

## 2021-08-24 VITALS — BP 151/80 | HR 78 | Ht 66.0 in | Wt 189.7 lb

## 2021-08-24 DIAGNOSIS — E663 Overweight: Secondary | ICD-10-CM

## 2021-08-24 DIAGNOSIS — J45909 Unspecified asthma, uncomplicated: Secondary | ICD-10-CM | POA: Diagnosis not present

## 2021-08-24 DIAGNOSIS — F419 Anxiety disorder, unspecified: Secondary | ICD-10-CM

## 2021-08-24 DIAGNOSIS — Z7189 Other specified counseling: Secondary | ICD-10-CM | POA: Diagnosis not present

## 2021-08-24 DIAGNOSIS — E782 Mixed hyperlipidemia: Secondary | ICD-10-CM

## 2021-08-24 DIAGNOSIS — R7989 Other specified abnormal findings of blood chemistry: Secondary | ICD-10-CM | POA: Diagnosis not present

## 2021-08-24 DIAGNOSIS — I10 Essential (primary) hypertension: Secondary | ICD-10-CM | POA: Diagnosis not present

## 2021-08-24 NOTE — Assessment & Plan Note (Signed)
Stable

## 2021-08-24 NOTE — Assessment & Plan Note (Signed)
Stable at the present time. 

## 2021-08-24 NOTE — Assessment & Plan Note (Signed)

## 2021-08-24 NOTE — Progress Notes (Signed)
? ?Established Patient Office Visit ? ?Subjective:  ?Patient ID: Alejandra Castro, female    DOB: 1955/10/19  Age: 66 y.o. MRN: [id number] ? ?CC:  ?Chief Complaint  ?Patient presents with  ? Lab Results  ? ? ?HPI ? ?Alejandra Castro presents for check up, her WBC count is elevated so we will recheck it again.  She denies any chest pain shortness of breath loss of weight, she complains of bloating and gas, she complains of tiredness and fatigue, she is little bit overweight but trying to lose weight.  for the rest of the past medical history surgical history and medication please see the list below ? ?Past Medical History:  ?Diagnosis Date  ? Arthritis   ? Hyperlipidemia   ? Hypertension   ? ? ?Past Surgical History:  ?Procedure Laterality Date  ? COLONOSCOPY    ? ? ?Family History  ?Problem Relation Age of Onset  ? Breast cancer Neg Hx   ? ? ?Social History  ? ?Socioeconomic History  ? Marital status: Widowed  ?  Spouse name: Not on file  ? Number of children: Not on file  ? Years of education: Not on file  ? Highest education level: Not on file  ?Occupational History  ? Not on file  ?Tobacco Use  ? Smoking status: Never  ? Smokeless tobacco: Never  ?Vaping Use  ? Vaping Use: Never used  ?Substance and Sexual Activity  ? Alcohol use: Yes  ?  Alcohol/week: 14.0 standard drinks  ?  Types: 14 Cans of beer per week  ? Drug use: No  ? Sexual activity: Not on file  ?Other Topics Concern  ? Not on file  ?Social History Narrative  ? Not on file  ? ?Social Determinants of Health  ? ?Financial Resource Strain: Not on file  ?Food Insecurity: Not on file  ?Transportation Needs: Not on file  ?Physical Activity: Not on file  ?Stress: Not on file  ?Social Connections: Not on file  ?Intimate Partner Violence: Not on file  ? ? ? ?Current Outpatient Medications:  ?  albuterol (VENTOLIN HFA) 108 (90 Base) MCG/ACT inhaler, Inhale into the lungs., Disp: , Rfl:  ?  amLODipine (NORVASC) 5 MG tablet, Take 1 tablet (5 mg total) by mouth  daily., Disp: 90 tablet, Rfl: 1 ?  aspirin EC 81 MG tablet, Take 81 mg by mouth daily., Disp: , Rfl:  ?  Calcium Carb-Cholecalciferol 600-10 MG-MCG TABS, Take by mouth., Disp: , Rfl:  ?  fluticasone (FLONASE) 50 MCG/ACT nasal spray, Place into the nose., Disp: , Rfl:  ?  lisinopril (ZESTRIL) 40 MG tablet, Take by mouth., Disp: , Rfl:  ?  magnesium oxide (MAG-OX) 400 MG tablet, Take by mouth., Disp: , Rfl:  ?  meclizine (ANTIVERT) 25 MG tablet, Take by mouth., Disp: , Rfl:  ?  metoprolol succinate (TOPROL-XL) 100 MG 24 hr tablet, Take by mouth., Disp: , Rfl:  ?  Multiple Vitamin (MULTIVITAMIN WITH MINERALS) TABS tablet, Take 1 tablet by mouth daily., Disp: , Rfl:  ?  Omega-3 Fatty Acids (FISH OIL PO), Take 2 capsules by mouth daily., Disp: , Rfl:  ?  simvastatin (ZOCOR) 20 MG tablet, Take 20 mg by mouth daily at 6 PM. , Disp: , Rfl:   ? ?No Known Allergies ? ?ROS ?Review of Systems  ?Constitutional: Negative.   ?HENT: Negative.    ?Eyes: Negative.   ?Respiratory: Negative.    ?Cardiovascular: Negative.   ?Gastrointestinal: Negative.   ?Endocrine: Negative.   ?  Genitourinary: Negative.   ?Musculoskeletal: Negative.   ?Skin: Negative.   ?Allergic/Immunologic: Negative.   ?Neurological: Negative.   ?Hematological: Negative.   ?Psychiatric/Behavioral: Negative.    ?All other systems reviewed and are negative. ? ?  ?Objective:  ?  ?Physical Exam ?Vitals reviewed.  ?Constitutional:   ?   Appearance: Normal appearance. She is obese. She is not ill-appearing or diaphoretic.  ?HENT:  ?   Head: Normocephalic.  ?   Nose: Nose normal.  ?   Mouth/Throat:  ?   Mouth: Mucous membranes are moist.  ?   Pharynx: No oropharyngeal exudate.  ?Eyes:  ?   Pupils: Pupils are equal, round, and reactive to light.  ?Neck:  ?   Vascular: No carotid bruit.  ?   Comments: No lymphadenopathy ?There is no evidence of hepatosplenomegaly there is no calf tenderness or swelling of the leg there are no lymph nodes in the axilla ?Cardiovascular:  ?    Rate and Rhythm: Normal rate and regular rhythm.  ?   Pulses: Normal pulses.  ?   Heart sounds: Normal heart sounds.  ?Pulmonary:  ?   Effort: Pulmonary effort is normal.  ?   Breath sounds: Normal breath sounds. No wheezing or rhonchi.  ?Chest:  ?   Chest wall: No tenderness.  ?Abdominal:  ?   General: Bowel sounds are normal.  ?   Palpations: Abdomen is soft. There is no hepatomegaly, splenomegaly or mass.  ?   Tenderness: There is no abdominal tenderness.  ?   Hernia: No hernia is present.  ?Musculoskeletal:     ?   General: No tenderness.  ?   Cervical back: Neck supple.  ?   Right lower leg: No edema.  ?   Left lower leg: No edema.  ?Skin: ?   Findings: No rash.  ?Neurological:  ?   Mental Status: She is alert and oriented to person, place, and time.  ?   Motor: No weakness.  ?Psychiatric:     ?   Mood and Affect: Mood and affect normal.     ?   Behavior: Behavior normal.  ? ? ?BP (!) 151/80   Pulse 78   Ht _0  (1.676 m)   Wt 189 lb 11.8 oz (86.1 kg)   BMI 30.62 kg/m?  ?Wt Readings from Last 3 Encounters:  ?08/24/21 189 lb 11.8 oz (86.1 kg)  ?08/03/21 189 lb 11.2 oz (86 kg)  ?03/14/21 194 lb 3.2 oz (88.1 kg)  ? ? ? ?Health Maintenance Due  ?Topic Date Due  ? Hepatitis C Screening  Never done  ? DEXA SCAN  Never done  ? COVID-19 Vaccine (3 - Pfizer risk series) 05/25/2020  ? ? ?There are no preventive care reminders to display for this patient. ? ?Lab Results  ?Component Value Date  ? TSH 4.95 (H) 08/22/2021  ? ?Lab Results  ?Component Value Date  ? WBC 11.5 (H) 08/22/2021  ? HGB 12.3 08/22/2021  ? HCT 36.8 08/22/2021  ? MCV 92.2 08/22/2021  ? PLT 285 08/22/2021  ? ?Lab Results  ?Component Value Date  ? NA 131 (L) 08/22/2021  ? K 4.3 08/22/2021  ? CO2 22 08/22/2021  ? GLUCOSE 106 (H) 08/22/2021  ? BUN 13 08/22/2021  ? CREATININE 0.58 08/22/2021  ? BILITOT 0.8 08/22/2021  ? AST 13 08/22/2021  ? ALT 12 08/22/2021  ? PROT 6.6 08/22/2021  ? CALCIUM 9.1 08/22/2021  ? EGFR 99 08/22/2021  ? ?Lab Results   ?  Component Value Date  ? CHOL 134 08/22/2021  ? ?Lab Results  ?Component Value Date  ? HDL 69 08/22/2021  ? ?Lab Results  ?Component Value Date  ? [place] 50 08/22/2021  ? ?Lab Results  ?Component Value Date  ? TRIG 73 08/22/2021  ? ?Lab Results  ?Component Value Date  ? CHOLHDL 1.9 08/22/2021  ? ?No results found for: HGBA1C ? ?  ?Assessment & Plan:  ? ?Problem List Items Addressed This Visit   ? ?  ? Cardiovascular and Mediastinum  ? Benign essential hypertension  ?   Patient denies any chest pain or shortness of breath there is no history of palpitation or paroxysmal nocturnal dyspnea ?  patient was advised to follow low-salt low-cholesterol diet ? ?  ideally I want to keep systolic blood pressure below 130 mmHg, patient was asked to check blood pressure one times a week and give me a report on that.  Patient will be follow-up in 3 months  or earlier as needed, patient will call me back for any change in the cardiovascular symptoms ?Patient was advised to buy a book from local bookstore concerning blood pressure and read several chapters  every day.  This will be supplemented by some of the material we will give him from the office.  Patient should also utilize other resources like YouTube and Internet to learn more about the blood pressure and the diet. ? ?  ?  ?  ? Respiratory  ? Asthma without status asthmaticus  ?  Stable at the present time ? ?  ?  ?  ? Other  ? CPAP use counseling  ? Overweight (BMI 25.0-29.9)  ?  - I encouraged the patient to lose weight.  ?- I educated them on making healthy dietary choices including eating more fruits and vegetables and less fried foods. ?- I encouraged the patient to exercise more, and educated on the benefits of exercise including weight loss, diabetes prevention, and hypertension prevention.  ? Dietary counseling with a registered dietician ? Referral to a weight management support group (e.g. Weight Watchers, Overeaters Anonymous) ?? If your BMI is greater than 29  or you have gained more than 15 pounds you should work on weight loss. ?? Attend a healthy cooking class  ? ?  ?  ? Anxiety  ?  Stable ? ?  ?  ? Mixed hyperlipidemia  ?  Hypercholesterolemia ? ?I advised the patient

## 2021-08-24 NOTE — Assessment & Plan Note (Signed)
Hypercholesterolemia  I advised the patient to follow Mediterranean diet This diet is rich in fruits vegetables and whole grain, and This diet is also rich in fish and lean meat Patient should also eat a handful of almonds or walnuts daily Recent heart study indicated that average follow-up on this kind of diet reduces the cardiovascular mortality by 50 to 70%== 

## 2021-08-24 NOTE — Assessment & Plan Note (Signed)

## 2021-08-24 NOTE — Assessment & Plan Note (Signed)
We will repeat CBC because of the leukocytosis and increased monocyte we will make a smear and reviewed by a pathologist to make sure ?She does not have any leukemia ?Her physical examination is unremarkable ?

## 2021-08-24 NOTE — Assessment & Plan Note (Signed)
We will repeat the thyroid profile ?

## 2021-08-25 DIAGNOSIS — G548 Other nerve root and plexus disorders: Secondary | ICD-10-CM | POA: Diagnosis not present

## 2021-08-25 DIAGNOSIS — L578 Other skin changes due to chronic exposure to nonionizing radiation: Secondary | ICD-10-CM | POA: Diagnosis not present

## 2021-08-25 DIAGNOSIS — Z86018 Personal history of other benign neoplasm: Secondary | ICD-10-CM | POA: Diagnosis not present

## 2021-08-25 DIAGNOSIS — L910 Hypertrophic scar: Secondary | ICD-10-CM | POA: Diagnosis not present

## 2021-08-30 ENCOUNTER — Other Ambulatory Visit: Payer: Self-pay | Admitting: *Deleted

## 2021-08-30 DIAGNOSIS — R109 Unspecified abdominal pain: Secondary | ICD-10-CM

## 2021-08-31 ENCOUNTER — Encounter: Payer: Self-pay | Admitting: Internal Medicine

## 2021-08-31 ENCOUNTER — Other Ambulatory Visit
Admission: RE | Admit: 2021-08-31 | Discharge: 2021-08-31 | Disposition: A | Discharge status: Home / Self Care | Payer: Medicare Other | Attending: Internal Medicine | Admitting: Internal Medicine

## 2021-08-31 DIAGNOSIS — Z0000 Encounter for general adult medical examination without abnormal findings: Secondary | ICD-10-CM | POA: Diagnosis not present

## 2021-08-31 DIAGNOSIS — R7989 Other specified abnormal findings of blood chemistry: Secondary | ICD-10-CM | POA: Insufficient documentation

## 2021-08-31 LAB — CBC WITH DIFFERENTIAL/PLATELET
Abs Immature Granulocytes: 0.03 10*3/uL (ref 0.00–0.07)
Basophils Absolute: 0 10*3/uL (ref 0.0–0.1)
Basophils Relative: 0 %
Eosinophils Absolute: 0.1 10*3/uL (ref 0.0–0.5)
Eosinophils Relative: 1 %
HCT: 36.7 % (ref 36.0–46.0)
Hemoglobin: 12.5 g/dL (ref 12.0–15.0)
Immature Granulocytes: 0 %
Lymphocytes Relative: 22 %
Lymphs Abs: 1.5 10*3/uL (ref 0.7–4.0)
MCH: 30.3 pg (ref 26.0–34.0)
MCHC: 34.1 g/dL (ref 30.0–36.0)
MCV: 89.1 fL (ref 80.0–100.0)
Monocytes Absolute: 0.6 10*3/uL (ref 0.1–1.0)
Monocytes Relative: 9 %
Neutro Abs: 4.7 10*3/uL (ref 1.7–7.7)
Neutrophils Relative %: 68 %
Platelets: 405 10*3/uL — ABNORMAL HIGH (ref 150–400)
RBC: 4.12 MIL/uL (ref 3.87–5.11)
RDW: 13.2 % (ref 11.5–15.5)
WBC: 7 10*3/uL (ref 4.0–10.5)
nRBC: 0 % (ref 0.0–0.2)

## 2021-08-31 LAB — PATHOLOGIST SMEAR REVIEW

## 2021-08-31 LAB — TSH: TSH: 2.712 u[IU]/mL (ref 0.350–4.500)

## 2021-09-01 ENCOUNTER — Other Ambulatory Visit: Payer: Self-pay | Admitting: *Deleted

## 2021-09-01 DIAGNOSIS — R109 Unspecified abdominal pain: Secondary | ICD-10-CM

## 2021-09-01 LAB — T3, FREE: T3, Free: 3.2 pg/mL (ref 2.0–4.4)

## 2021-09-01 LAB — T4: T4, Total: 7.2 ug/dL (ref 4.5–12.0)

## 2021-09-10 ENCOUNTER — Ambulatory Visit
Admission: EM | Admit: 2021-09-10 | Discharge: 2021-09-10 | Disposition: A | Discharge status: Home / Self Care | Payer: Medicare Other

## 2021-09-10 ENCOUNTER — Emergency Department
Admission: EM | Admit: 2021-09-10 | Discharge: 2021-09-10 | Disposition: A | Discharge status: Home / Self Care | Payer: Medicare Other | Attending: Student in an Organized Health Care Education/Training Program | Admitting: Student in an Organized Health Care Education/Training Program

## 2021-09-10 ENCOUNTER — Encounter: Payer: Self-pay | Admitting: Emergency Medicine

## 2021-09-10 ENCOUNTER — Other Ambulatory Visit: Payer: Self-pay

## 2021-09-10 ENCOUNTER — Emergency Department: Payer: Medicare Other

## 2021-09-10 DIAGNOSIS — K7689 Other specified diseases of liver: Secondary | ICD-10-CM | POA: Diagnosis not present

## 2021-09-10 DIAGNOSIS — K5730 Diverticulosis of large intestine without perforation or abscess without bleeding: Secondary | ICD-10-CM | POA: Diagnosis not present

## 2021-09-10 DIAGNOSIS — I10 Essential (primary) hypertension: Secondary | ICD-10-CM | POA: Insufficient documentation

## 2021-09-10 DIAGNOSIS — K5792 Diverticulitis of intestine, part unspecified, without perforation or abscess without bleeding: Secondary | ICD-10-CM | POA: Insufficient documentation

## 2021-09-10 DIAGNOSIS — R1032 Left lower quadrant pain: Secondary | ICD-10-CM

## 2021-09-10 LAB — URINALYSIS, ROUTINE W REFLEX MICROSCOPIC
Bilirubin Urine: NEGATIVE
Glucose, UA: NEGATIVE mg/dL
Hgb urine dipstick: NEGATIVE
Ketones, ur: NEGATIVE mg/dL
Leukocytes,Ua: NEGATIVE
Nitrite: NEGATIVE
Protein, ur: NEGATIVE mg/dL
Specific Gravity, Urine: 1.003 — ABNORMAL LOW (ref 1.005–1.030)
pH: 7 (ref 5.0–8.0)

## 2021-09-10 LAB — CBC
HCT: 37.3 % (ref 36.0–46.0)
Hemoglobin: 12.6 g/dL (ref 12.0–15.0)
MCH: 29.9 pg (ref 26.0–34.0)
MCHC: 33.8 g/dL (ref 30.0–36.0)
MCV: 88.4 fL (ref 80.0–100.0)
Platelets: 327 K/uL (ref 150–400)
RBC: 4.22 MIL/uL (ref 3.87–5.11)
RDW: 13.2 % (ref 11.5–15.5)
WBC: 12.6 K/uL — ABNORMAL HIGH (ref 4.0–10.5)
nRBC: 0 % (ref 0.0–0.2)

## 2021-09-10 LAB — COMPREHENSIVE METABOLIC PANEL
ALT: 16 U/L (ref 0–44)
AST: 17 U/L (ref 15–41)
Albumin: 4.1 g/dL (ref 3.5–5.0)
Alkaline Phosphatase: 79 U/L (ref 38–126)
Anion gap: 9 (ref 5–15)
BUN: 13 mg/dL (ref 8–23)
CO2: 25 mmol/L (ref 22–32)
Calcium: 8.9 mg/dL (ref 8.9–10.3)
Chloride: 97 mmol/L — ABNORMAL LOW (ref 98–111)
Creatinine, Ser: 0.59 mg/dL (ref 0.44–1.00)
GFR, Estimated: >60 mL/min (ref >60)
Glucose, Bld: 108 mg/dL — ABNORMAL HIGH (ref 70–99)
Potassium: 3.5 mmol/L (ref 3.5–5.1)
Sodium: 131 mmol/L — ABNORMAL LOW (ref 135–145)
Total Bilirubin: 0.9 mg/dL (ref 0.3–1.2)
Total Protein: 7.6 g/dL (ref 6.5–8.1)

## 2021-09-10 LAB — LIPASE, BLOOD: Lipase: 29 U/L (ref 11–51)

## 2021-09-10 MED ORDER — HYDROCODONE-ACETAMINOPHEN 5-325 MG PO TABS: 1.0000 | ORAL_TABLET | Freq: Four times a day (QID) | ORAL | 0 refills | Status: DC | PRN

## 2021-09-10 MED ORDER — METRONIDAZOLE 500 MG PO TABS: 500.0000 mg | ORAL_TABLET | Freq: Three times a day (TID) | ORAL | 0 refills | Status: AC

## 2021-09-10 MED ORDER — CIPROFLOXACIN IN D5W 400 MG/200ML IV SOLN
400.0000 mg | Freq: Once | INTRAVENOUS | Status: AC
  Administered 2021-09-10: 400 mg via INTRAVENOUS
  Filled 2021-09-10: qty 200

## 2021-09-10 MED ORDER — METRONIDAZOLE 500 MG/100ML IV SOLN
500.0000 mg | Freq: Once | INTRAVENOUS | Status: AC
  Administered 2021-09-10: 500 mg via INTRAVENOUS
  Filled 2021-09-10: qty 100

## 2021-09-10 MED ORDER — CIPROFLOXACIN HCL 500 MG PO TABS: 500.0000 mg | ORAL_TABLET | Freq: Two times a day (BID) | ORAL | 0 refills | Status: AC

## 2021-09-10 MED ORDER — IOHEXOL 300 MG/ML  SOLN
100.0000 mL | Freq: Once | INTRAMUSCULAR | Status: AC | PRN
  Administered 2021-09-10: 100 mL via INTRAVENOUS

## 2021-09-10 NOTE — ED Notes (Signed)
Pt pointed to her pain. At her L hip or leg joint area. Pt saw PCP. Was referred to GI, but no appt til Oct.

## 2021-09-10 NOTE — Discharge Instructions (Signed)
Follow up with GI doctor you have been referred to Follow up with your regular doctor to discuss need for earlier colonoscopy Take the antibiotics as prescribed Return if worsening

## 2021-09-10 NOTE — ED Provider Notes (Signed)
[facility]    CSN: [id number] Arrival date & time: 09/10/21  0623      History   Chief Complaint No chief complaint on file.   HPI Alejandra Castro is a 66 y.o. female presenting with left lower quadrant pain intermittently for months, worse over the last week.  Per patient history of diverticulitis per colonoscopy in 2019, no recent abdominal imaging.  She states that she is awaiting a referral to GI, but this is not until October, and the pain is unbearable today.  She describes 8/10 crampy pain in the lower left quadrant.  Has not identified triggers for the pain, but it persisted throughout the day.  There is some associated nausea without vomiting.  Also with subjective chills, but no known fevers.  Last bowel movement was this morning and was normal, without blood or black and tarry stool.  HPI  Past Medical History:  Diagnosis Date   Arthritis    Hyperlipidemia    Hypertension     Patient Active Problem List   Diagnosis Date Noted   Abnormal CBC 08/24/2021   Abnormal TSH 08/24/2021   Depression 03/14/2021   Mixed hyperlipidemia 03/14/2021   Insomnia 03/14/2021   CPAP use counseling 03/15/2020   Overweight (BMI 25.0-29.9) 03/15/2020   Anxiety 06/27/2017   OSA on CPAP 02/23/2017   Benign essential hypertension 12/10/2013   Asthma without status asthmaticus 12/10/2013   Essential hypertension 12/10/2013    Past Surgical History:  Procedure Laterality Date   COLONOSCOPY      OB History   No obstetric history on file.      Home Medications    Prior to Admission medications   Medication Sig Start Date End Date Taking? Authorizing Provider  albuterol (VENTOLIN HFA) 108 (90 Base) MCG/ACT inhaler Inhale into the lungs. 06/22/16   [provider]  amLODipine (NORVASC) 5 MG tablet Take 1 tablet (5 mg total) by mouth daily. 08/18/21   Cletis Athens, MD  aspirin EC 81 MG tablet Take 81 mg by mouth daily.    [provider]  Calcium  Carb-Cholecalciferol 600-10 MG-MCG TABS Take by mouth.    [provider]  fluticasone (FLONASE) 50 MCG/ACT nasal spray Place into the nose.    [provider]  lisinopril (ZESTRIL) 40 MG tablet Take by mouth. 01/04/21   [provider]  magnesium oxide (MAG-OX) 400 MG tablet Take by mouth.    [provider]  meclizine (ANTIVERT) 25 MG tablet Take by mouth. 03/07/21   [provider]  metoprolol succinate (TOPROL-XL) 100 MG 24 hr tablet Take by mouth. 01/04/21 01/04/22  [provider]  Multiple Vitamin (MULTIVITAMIN WITH MINERALS) TABS tablet Take 1 tablet by mouth daily.    [provider]  Omega-3 Fatty Acids (FISH OIL PO) Take 2 capsules by mouth daily.    [provider]  simvastatin (ZOCOR) 20 MG tablet Take 20 mg by mouth daily at 6 PM.     [provider]  felodipine (PLENDIL) 5 MG 24 hr tablet Take 5 mg by mouth at bedtime.   12/02/19  [provider]  FLUoxetine (PROZAC) 20 MG capsule Take 20-40 mg by mouth every morning. USUALLY JUST TAKES 1 BUT WILL TAKE 2 IF NEEDED  08/31/16 12/02/19  [provider]    Family History Family History  Problem Relation Age of Onset   Breast cancer Neg Hx     Social History Social History   Tobacco Use  Smoking status: Never   Smokeless tobacco: Never  Vaping Use   Vaping Use: Never used  Substance Use Topics   Alcohol use: Yes    Alcohol/week: 14.0 standard drinks    Types: 14 Cans of beer per week   Drug use: No     Allergies   Patient has no known allergies.   Review of Systems Review of Systems  Gastrointestinal:  Positive for abdominal pain.  All other systems reviewed and are negative.   Physical Exam Triage Vital Signs ED Triage Vitals  Enc Vitals Group     BP 09/10/21 0841 (!) 151/83     Pulse Rate 09/10/21 0841 77     Resp 09/10/21 0841 20     Temp 09/10/21 0841 98.2 F (36.8 C)     Temp Source 09/10/21 0841 Oral      SpO2 09/10/21 0841 99 %     Weight 09/10/21 0840 185 lb (83.9 kg)     Height 09/10/21 0840 '5\' 6"'$  (1.676 m)     Head Circumference --      Peak Flow --      Pain Score 09/10/21 0840 10     Pain Loc --      Pain Edu? --      Excl. in [place]? --    No data found.  Updated Vital Signs BP (!) 151/83 (BP Location: Right Arm)   Pulse 77   Temp 98.2 F (36.8 C) (Oral)   Resp 20   Ht '5\' 6"'$  (1.676 m)   Wt 185 lb (83.9 kg)   SpO2 99%   BMI 29.86 kg/m   Visual Acuity Right Eye Distance:   Left Eye Distance:   Bilateral Distance:    Right Eye Near:   Left Eye Near:    Bilateral Near:     Physical Exam Vitals reviewed.  Constitutional:      General: She is not in acute distress.    Appearance: Normal appearance. She is not ill-appearing.  HENT:     Head: Normocephalic and atraumatic.     Mouth/Throat:     Mouth: Mucous membranes are moist.     Comments: Moist mucous membranes Eyes:     Extraocular Movements: Extraocular movements intact.     Pupils: Pupils are equal, round, and reactive to light.  Cardiovascular:     Rate and Rhythm: Normal rate and regular rhythm.     Heart sounds: Normal heart sounds.  Pulmonary:     Effort: Pulmonary effort is normal.     Breath sounds: Normal breath sounds. No wheezing, rhonchi or rales.  Abdominal:     General: Bowel sounds are normal. There is no distension.     Palpations: Abdomen is soft. There is no mass.     Tenderness: There is abdominal tenderness in the left lower quadrant. There is rebound. There is no right CVA tenderness, left CVA tenderness or guarding. Negative signs include Murphy's sign, Rovsing's sign and McBurney's sign.     Comments: Significantly tender to palpation in the left lower quadrant with guarding and rebound.  Skin:    General: Skin is warm.     Capillary Refill: Capillary refill takes less than 2 seconds.     Comments: Good skin turgor  Neurological:     General: No focal deficit present.     Mental  Status: She is alert and oriented to person, place, and time.  Psychiatric:        Mood and Affect:  Mood normal.        Behavior: Behavior normal.     UC Treatments / Results  Labs (all labs ordered are listed, but only abnormal results are displayed) Labs Reviewed  URINALYSIS, [place] MICROSCOPIC    EKG   Radiology No results found.  Procedures Procedures (including critical care time)  Medications Ordered in UC Medications - No data to display  Initial Impression / Assessment and Plan / UC Course  I have reviewed the triage vital signs and the nursing notes.  Pertinent labs & imaging results that were available during my care of the patient were reviewed by me and considered in my medical decision making (see chart for details).     This patient is a very pleasant 66 y.o. year old female presenting with possible diverticulitis. Afebrile, nontachy.  Given significant left lower quadrant pain with guarding and rebound, as well as subjective chills, I do have concern for acute diverticulitis.  She does not have any recent abdominal imaging, and her last colonoscopy was in 2019.  As we do not have ability for CT abdomen in the urgent care setting, she is amenable to emergency department referral for further management.  Sent via POV.   Final Clinical Impressions(s) / UC Diagnoses   Final diagnoses:  LLQ pain     Discharge Instructions      Sent to ED via POV. She is in agreement    ED Prescriptions   None    PDMP not reviewed this encounter.   Hazel Sams, PA-C 09/10/21 [phone]

## 2021-09-10 NOTE — Discharge Instructions (Addendum)
Sent to ED via POV. She is in agreement

## 2021-09-10 NOTE — ED Triage Notes (Signed)
Patient presents to UC for cramping on left side and lower back pain -- more frequent.  Patient thinks its diverticulitis. Patient did see PCP and has a referral to GI -- October is the appt.    Patient c/o pressure when urination.

## 2021-09-10 NOTE — ED Triage Notes (Signed)
Pt reports severe pain to her left lower abd for 2 weeks. Pt reports pain is burning in nature and intermittent. Pt reports some nausea as well. Pt denies SOB, vomiting or other concerns. Last BM was this am and normal per pt. Pt states was dx'd with diverticulitis awhile back but those sx's were mild compared to this.

## 2021-09-10 NOTE — ED Provider Notes (Signed)
[facility] Provider Note    Event Date/Time   First MD Initiated Contact with Patient 09/10/21 [phone]     (approximate)   History   Abdominal Pain and Nausea   HPI  Alejandra Castro is a 66 y.o. female with history of diverticulosis hypertension, OSA presents to the emergency department with left lower quadrant pain.  Patient states a few years ago on a colonoscopy they diagnosed diverticulosis.  She states now she is having severe pain left lower quadrant onset was 2 weeks ago.  States getting worse.  Said bowel movements are still normal but have an odd odor and almost like a mucousy smell.  She denies any fever or chills.  Tried to go to urgent care but they sent her here due to imaging needs.      Physical Exam   Triage Vital Signs: ED Triage Vitals  Enc Vitals Group     BP 09/10/21 0923 (!) 174/97     Pulse Rate 09/10/21 0923 74     Resp 09/10/21 0923 20     Temp 09/10/21 0924 98.7 F (37.1 C)     Temp Source 09/10/21 0924 Oral     SpO2 09/10/21 0923 98 %     Weight 09/10/21 0921 185 lb (83.9 kg)     Height 09/10/21 0921 '5\' 6"'$  (1.676 m)     Head Circumference --      Peak Flow --      Pain Score 09/10/21 0921 10     Pain Loc --      Pain Edu? --      Excl. in [place]? --     Most recent vital signs: Vitals:   09/10/21 0923 09/10/21 0924  BP: (!) 174/97   Pulse: 74   Resp: 20   Temp:  98.7 F (37.1 C)  SpO2: 98%      General: Awake, no distress.   CV:  Good peripheral perfusion. regular rate and  rhythm Resp:  Normal effort.  Abd:  No distention.  Tender in the left lower quadrant, bowel sounds normal Other:      ED Results / Procedures / Treatments   Labs (all labs ordered are listed, but only abnormal results are displayed) Labs Reviewed  COMPREHENSIVE METABOLIC PANEL - Abnormal; Notable for the following components:      Result Value   Sodium 131 (*)    Chloride 97 (*)    Glucose, Bld 108 (*)    All other components  within normal limits  CBC - Abnormal; Notable for the following components:   WBC 12.6 (*)    All other components within normal limits  URINALYSIS, ROUTINE W REFLEX MICROSCOPIC - Abnormal; Notable for the following components:   Color, Urine STRAW (*)    APPearance CLEAR (*)    Specific Gravity, Urine 1.003 (*)    All other components within normal limits  LIPASE, BLOOD     EKG     RADIOLOGY CT abdomen/pelvis IV contrast    PROCEDURES:   Procedures   MEDICATIONS ORDERED IN ED: Medications  ciprofloxacin (CIPRO) IVPB 400 mg (400 mg Intravenous New Bag/Given 09/10/21 1109)  metroNIDAZOLE (FLAGYL) IVPB 500 mg (has no administration in time range)  iohexol (OMNIPAQUE) 300 MG/ML solution 100 mL (100 mLs Intravenous Contrast Given 09/10/21 1012)     IMPRESSION / MDM / ASSESSMENT AND PLAN / ED COURSE  I reviewed the triage vital signs and the nursing notes.  Differential diagnosis includes, but is not limited to, acute diverticulitis, bowel obstruction, abscess, ovarian cyst, kidney stone  Patient's presentation is most consistent with acute presentation with potential threat to life or bodily function.   Labs are reviewed, CBC is elevated WBC of 12.6 which could indicate infection, patient's sodium is a little low at 130 once we could give her normal saline to correct this number, urinalysis is normal, lipase is normal.  Ordered CT abdomen/pelvis with IV contrast due to left lower quadrant pain and concerns for acute diverticulitis versus abscess  I did offer the patient pain medication, however she drove herself and would like to wait to see what the CT results are prior to taking any pain medication.  Patient CT is reassuring for diverticulitis.  This is interpreted by me and confirmed by radiology.  Patient also was informed that the radiologist has some concerns for possible malignancy.  She is scheduled to see GI in 3 months however  explained to her she should follow-up with her PCP to see if he thinks she needs to be seen sooner than 3 months as it may be a cancerous lesion.  Understands and is in agreement with her treatment plan.  She was given Cipro and Flagyl IV while here in the ED.  She will be given a prescription for Cipro Flagyl and pain medication at discharge.  Do not feel that she needs to be admitted as she is afebrile and her white count is not highly elevated.  No abscess was noted on the CT.  She was discharged stable condition.        FINAL CLINICAL IMPRESSION(S) / ED DIAGNOSES   Final diagnoses:  Acute diverticulitis     Rx / DC Orders   ED Discharge Orders          Ordered    metroNIDAZOLE (FLAGYL) 500 MG tablet  3 times daily        09/10/21 1105    ciprofloxacin (CIPRO) 500 MG tablet  2 times daily        09/10/21 1105    HYDROcodone-acetaminophen (NORCO/VICODIN) 5-325 MG tablet  Every 6 hours PRN        09/10/21 1105             Note:  This document was prepared using Dragon voice recognition software and may include unintentional dictation errors.    Versie Starks, PA-C 09/10/21 1208    Merlyn Lot, MD 09/10/21 [phone]

## 2021-09-10 NOTE — ED Notes (Signed)
Patient is being discharged from the Urgent Care and sent to the [facility] Emergency Department via private vehicle . Per Marin Roberts, PA, patient is in need of higher level of care due to severe LLQ abdominal pain. Patient is aware and verbalizes understanding of plan of care.  Vitals:   09/10/21 0841  BP: (!) 151/83  Pulse: 77  Resp: 20  Temp: 98.2 F (36.8 C)  SpO2: 99%

## 2021-09-19 ENCOUNTER — Other Ambulatory Visit: Payer: Self-pay | Admitting: *Deleted

## 2021-09-19 DIAGNOSIS — R109 Unspecified abdominal pain: Secondary | ICD-10-CM

## 2021-09-22 DIAGNOSIS — G4733 Obstructive sleep apnea (adult) (pediatric): Secondary | ICD-10-CM | POA: Diagnosis not present

## 2021-10-11 ENCOUNTER — Encounter: Payer: Self-pay | Admitting: Emergency Medicine

## 2021-10-11 ENCOUNTER — Emergency Department
Admission: EM | Admit: 2021-10-11 | Discharge: 2021-10-11 | Disposition: A | Discharge status: Home / Self Care | Payer: Medicare Other | Attending: Emergency Medicine | Admitting: Emergency Medicine

## 2021-10-11 ENCOUNTER — Emergency Department: Payer: Medicare Other

## 2021-10-11 ENCOUNTER — Other Ambulatory Visit: Payer: Self-pay

## 2021-10-11 DIAGNOSIS — K5792 Diverticulitis of intestine, part unspecified, without perforation or abscess without bleeding: Secondary | ICD-10-CM

## 2021-10-11 DIAGNOSIS — K5732 Diverticulitis of large intestine without perforation or abscess without bleeding: Secondary | ICD-10-CM | POA: Diagnosis not present

## 2021-10-11 DIAGNOSIS — I10 Essential (primary) hypertension: Secondary | ICD-10-CM | POA: Insufficient documentation

## 2021-10-11 DIAGNOSIS — J45909 Unspecified asthma, uncomplicated: Secondary | ICD-10-CM | POA: Insufficient documentation

## 2021-10-11 DIAGNOSIS — R1032 Left lower quadrant pain: Secondary | ICD-10-CM | POA: Diagnosis not present

## 2021-10-11 DIAGNOSIS — D72829 Elevated white blood cell count, unspecified: Secondary | ICD-10-CM | POA: Diagnosis not present

## 2021-10-11 LAB — CBC WITH DIFFERENTIAL/PLATELET
Abs Immature Granulocytes: 0.04 10*3/uL (ref 0.00–0.07)
Basophils Absolute: 0 10*3/uL (ref 0.0–0.1)
Basophils Relative: 0 %
Eosinophils Absolute: 0.1 10*3/uL (ref 0.0–0.5)
Eosinophils Relative: 1 %
HCT: 37.9 % (ref 36.0–46.0)
Hemoglobin: 13.1 g/dL (ref 12.0–15.0)
Immature Granulocytes: 0 %
Lymphocytes Relative: 10 %
Lymphs Abs: 1.2 10*3/uL (ref 0.7–4.0)
MCH: 30.1 pg (ref 26.0–34.0)
MCHC: 34.6 g/dL (ref 30.0–36.0)
MCV: 87.1 fL (ref 80.0–100.0)
Monocytes Absolute: 1 10*3/uL (ref 0.1–1.0)
Monocytes Relative: 8 %
Neutro Abs: 9.7 10*3/uL — ABNORMAL HIGH (ref 1.7–7.7)
Neutrophils Relative %: 81 %
Platelets: 279 10*3/uL (ref 150–400)
RBC: 4.35 MIL/uL (ref 3.87–5.11)
RDW: 12.6 % (ref 11.5–15.5)
WBC: 12 10*3/uL — ABNORMAL HIGH (ref 4.0–10.5)
nRBC: 0 % (ref 0.0–0.2)

## 2021-10-11 LAB — URINALYSIS, ROUTINE W REFLEX MICROSCOPIC
Bilirubin Urine: NEGATIVE
Glucose, UA: NEGATIVE mg/dL
Ketones, ur: NEGATIVE mg/dL
Leukocytes,Ua: NEGATIVE
Nitrite: NEGATIVE
Protein, ur: NEGATIVE mg/dL
Specific Gravity, Urine: 1.004 — ABNORMAL LOW (ref 1.005–1.030)
pH: 7 (ref 5.0–8.0)

## 2021-10-11 LAB — COMPREHENSIVE METABOLIC PANEL
ALT: 17 U/L (ref 0–44)
AST: 22 U/L (ref 15–41)
Albumin: 4.3 g/dL (ref 3.5–5.0)
Alkaline Phosphatase: 89 U/L (ref 38–126)
Anion gap: 11 (ref 5–15)
BUN: 11 mg/dL (ref 8–23)
CO2: 22 mmol/L (ref 22–32)
Calcium: 9.1 mg/dL (ref 8.9–10.3)
Chloride: 98 mmol/L (ref 98–111)
Creatinine, Ser: 0.54 mg/dL (ref 0.44–1.00)
GFR, Estimated: >60 mL/min (ref >60)
Glucose, Bld: 113 mg/dL — ABNORMAL HIGH (ref 70–99)
Potassium: 3.5 mmol/L (ref 3.5–5.1)
Sodium: 131 mmol/L — ABNORMAL LOW (ref 135–145)
Total Bilirubin: 0.6 mg/dL (ref 0.3–1.2)
Total Protein: 7.7 g/dL (ref 6.5–8.1)

## 2021-10-11 LAB — LIPASE, BLOOD: Lipase: 26 U/L (ref 11–51)

## 2021-10-11 MED ORDER — AMOXICILLIN-POT CLAVULANATE 875-125 MG PO TABS: 1.0000 | ORAL_TABLET | Freq: Two times a day (BID) | ORAL | 0 refills | Status: AC

## 2021-10-11 MED ORDER — IOHEXOL 300 MG/ML  SOLN
80.0000 mL | Freq: Once | INTRAMUSCULAR | Status: AC | PRN
Start: 2021-10-11 — End: 2021-10-11
  Administered 2021-10-11: 80 mL via INTRAVENOUS

## 2021-10-11 MED ORDER — ONDANSETRON 4 MG PO TBDP
4.0000 mg | ORAL_TABLET | Freq: Three times a day (TID) | ORAL | 0 refills | Status: DC | PRN
Start: 2021-10-11 — End: 2021-12-02

## 2021-10-11 MED ORDER — ONDANSETRON HCL 4 MG/2ML IJ SOLN
4.0000 mg | Freq: Once | INTRAMUSCULAR | Status: AC
  Administered 2021-10-11: 4 mg via INTRAVENOUS
  Filled 2021-10-11: qty 2

## 2021-10-11 MED ORDER — MORPHINE SULFATE (PF) 4 MG/ML IV SOLN
4.0000 mg | Freq: Once | INTRAVENOUS | Status: AC
  Administered 2021-10-11: 4 mg via INTRAVENOUS
  Filled 2021-10-11: qty 1

## 2021-10-11 MED ORDER — LACTATED RINGERS IV BOLUS
1000.0000 mL | Freq: Once | INTRAVENOUS | Status: AC
Start: 2021-10-11 — End: 2021-10-11
  Administered 2021-10-11: 1000 mL via INTRAVENOUS

## 2021-10-11 MED ORDER — OXYCODONE-ACETAMINOPHEN 5-325 MG PO TABS: 1.0000 | ORAL_TABLET | ORAL | 0 refills | Status: DC | PRN

## 2021-10-11 NOTE — ED Notes (Signed)
Pt in bed, pt states that she doesn't need anything more for pain at this time, pt states that her son is coming to get her. Pt verbalized understanding d/c instructions and follow up, pt from dpt

## 2021-10-11 NOTE — ED Provider Notes (Signed)
[facility] Provider Note    Event Date/Time   First MD Initiated Contact with Patient 10/11/21 [phone]     (approximate)   History   Chief Complaint Abdominal Pain   HPI  Alejandra Castro is a 66 y.o. female with past medical history of hypertension, hyperlipidemia, and asthma who presents to the ED complaining of abdominal pain.  Patient reports that she has had about 4 days of gradually worsening pain in the left lower quadrant of her abdomen.  She describes the pain as sharp and constant, worse when bending over along with other certain positions.  She states that the pain also seems severe before she urinates and is alleviated after she is able to urinate.  She denies any associated dysuria or hematuria, has not had any flank pain.  She denies any fevers, nausea, vomiting, or changes in her bowel movements.  She describes symptoms as similar to when she was diagnosed with diverticulitis last month, had completed a course of antibiotics and was feeling better before the pain returned recently.     Physical Exam   Triage Vital Signs: ED Triage Vitals  Enc Vitals Group     BP 10/11/21 0616 (!) 146/86     Pulse Rate 10/11/21 0616 76     Resp 10/11/21 0616 17     Temp 10/11/21 0616 99.2 F (37.3 C)     Temp Source 10/11/21 0616 Oral     SpO2 10/11/21 0616 100 %     Weight 10/11/21 0616 179 lb (81.2 kg)     Height 10/11/21 0616 '5\' 6"'$  (1.676 m)     Head Circumference --      Peak Flow --      Pain Score 10/11/21 0620 6     Pain Loc --      Pain Edu? --      Excl. in [place]? --     Most recent vital signs: Vitals:   10/11/21 0814 10/11/21 0929  BP: (!) 150/70 132/70  Pulse: 65 64  Resp: 17 18  Temp: 98.7 F (37.1 C)   SpO2: 97% 99%    Constitutional: Alert and oriented. Eyes: Conjunctivae are normal. Head: Atraumatic. Nose: No congestion/rhinnorhea. Mouth/Throat: Mucous membranes are moist.  Cardiovascular: Normal rate, regular rhythm.  Grossly normal heart sounds.  2+ radial pulses bilaterally. Respiratory: Normal respiratory effort.  No retractions. Lungs CTAB. Gastrointestinal: Soft and tender to palpation in the left lower quadrant with no rebound or guarding.  No CVA tenderness bilaterally. No distention. Musculoskeletal: No lower extremity tenderness nor edema.  Neurologic:  Normal speech and language. No gross focal neurologic deficits are appreciated.    ED Results / Procedures / Treatments   Labs (all labs ordered are listed, but only abnormal results are displayed) Labs Reviewed  CBC WITH DIFFERENTIAL/PLATELET - Abnormal; Notable for the following components:      Result Value   WBC 12.0 (*)    Neutro Abs 9.7 (*)    All other components within normal limits  COMPREHENSIVE METABOLIC PANEL - Abnormal; Notable for the following components:   Sodium 131 (*)    Glucose, Bld 113 (*)    All other components within normal limits  URINALYSIS, ROUTINE W REFLEX MICROSCOPIC - Abnormal; Notable for the following components:   Color, Urine STRAW (*)    APPearance CLEAR (*)    Specific Gravity, Urine 1.004 (*)    Hgb urine dipstick SMALL (*)    Bacteria, UA RARE (*)  All other components within normal limits  LIPASE, BLOOD    RADIOLOGY CT abdomen/pelvis reviewed and interpreted by me with inflammatory changes in the left lower quadrant concerning for diverticulitis, no focal fluid collections or dilated bowel loops noted.  PROCEDURES:  Critical Care performed: No  Procedures   MEDICATIONS ORDERED IN ED: Medications  lactated ringers bolus 1,000 mL (0 mLs Intravenous Stopped 10/11/21 0929)  morphine (PF) 4 MG/ML injection 4 mg (4 mg Intravenous Given 10/11/21 0822)  ondansetron (ZOFRAN) injection 4 mg (4 mg Intravenous Given 10/11/21 0819)  iohexol (OMNIPAQUE) 300 MG/ML solution 80 mL (80 mLs Intravenous Contrast Given 10/11/21 0836)     IMPRESSION / MDM / ASSESSMENT AND PLAN / ED COURSE  I reviewed the  triage vital signs and the nursing notes.                              66 y.o. female with past medical history of hypertension, hyperlipidemia, and asthma who presents to the ED complaining of 4 days of gradually worsening pain in the left lower quadrant of her abdomen.  Patient's presentation is most consistent with acute presentation with potential threat to life or bodily function.  Differential diagnosis includes, but is not limited to, diverticulitis, abscess, kidney stone, UTI, malignancy.  Patient nontoxic-appearing and in no acute distress, vital signs are unremarkable.  She does have tenderness in the left lower quadrant of her abdomen and we will further assess with CT scan for diverticulitis as well as potential complication.  Labs thus far are reassuring and remarkable for mild leukocytosis without significant anemia, electrolyte abnormality, or AKI.  LFTs and lipase are within normal limits.  We will treat symptomatically with IV morphine and Zofran, hydrate with IV fluids.  Urinalysis without signs of infection.  CT scan is consistent with acute diverticulitis without apparent complication.  Patient reports feeling better following IV morphine and Zofran, she is appropriate for outpatient management with PCP and GI follow-up.  She was treated with Cipro and Flagyl previously, we will now treat with a course of Augmentin.  She was counseled to return to the ED for new or worsening symptoms, patient agrees with plan.      FINAL CLINICAL IMPRESSION(S) / ED DIAGNOSES   Final diagnoses:  Diverticulitis     Rx / DC Orders   ED Discharge Orders          Ordered    amoxicillin-clavulanate (AUGMENTIN) 875-125 MG tablet  2 times daily        10/11/21 0948    oxyCODONE-acetaminophen (PERCOCET) 5-325 MG tablet  Every 4 hours PRN        10/11/21 0948    ondansetron (ZOFRAN-ODT) 4 MG disintegrating tablet  Every 8 hours PRN        10/11/21 0948             Note:  This  document was prepared using Dragon voice recognition software and may include unintentional dictation errors.   Blake Divine, MD 10/11/21 [phone]

## 2021-10-11 NOTE — ED Notes (Signed)
Pt back from ct, pt reports a decrease in pain, states that pain is a 5/10, states that she doesn't need anything more for pain at this time.

## 2021-10-11 NOTE — ED Triage Notes (Signed)
Patient ambulatory to triage with steady gait, without difficulty or distress noted; pt reports left lower abd pain with no accomp symptoms; st hx of diverticulitis and has GI appt in October, unable to get sooner

## 2021-10-28 ENCOUNTER — Ambulatory Visit (INDEPENDENT_AMBULATORY_CARE_PROVIDER_SITE_OTHER): Payer: Medicare Other | Admitting: *Deleted

## 2021-10-28 DIAGNOSIS — Z0000 Encounter for general adult medical examination without abnormal findings: Secondary | ICD-10-CM | POA: Diagnosis not present

## 2021-10-28 NOTE — Progress Notes (Cosign Needed)
Subjective:   Alejandra Castro is a 66 y.o. female who presents for an Initial Medicare Annual Wellness Visit.   I discussed the limitations of evaluation and management by telemedicine and the availability of in person appointments. Patient expressed understanding and agreed to proceed.   Visit performed using audio  Patient:home Provider:home    Review of Systems    Defer to provider       Objective:    Today's Vitals   10/28/21 1420 10/28/21 1421  PainSc: 0-No pain 0-No pain   There is no height or weight on file to calculate BMI.     10/28/2021    2:26 PM 10/11/2021    6:20 AM 10/12/2017    1:21 PM 10/13/2016    3:28 PM 10/02/2016    2:13 PM 08/14/2016   11:31 AM  Advanced Directives  Does Patient Have a Medical Advance Directive? No No No Yes No No  Would patient like information on creating a medical advance directive? No - Patient declined No - Patient declined No - Patient declined  No - Patient declined     Current Medications (verified) Outpatient Encounter Medications as of 10/28/2021  Medication Sig   albuterol (VENTOLIN HFA) 108 (90 Base) MCG/ACT inhaler Inhale into the lungs.   amLODipine (NORVASC) 5 MG tablet Take 1 tablet (5 mg total) by mouth daily.   aspirin EC 81 MG tablet Take 81 mg by mouth daily.   Calcium Carb-Cholecalciferol 600-10 MG-MCG TABS Take by mouth.   fluticasone (FLONASE) 50 MCG/ACT nasal spray Place into the nose.   lisinopril (ZESTRIL) 40 MG tablet Take by mouth.   magnesium oxide (MAG-OX) 400 MG tablet Take by mouth.   meclizine (ANTIVERT) 25 MG tablet Take by mouth.   metoprolol succinate (TOPROL-XL) 100 MG 24 hr tablet Take by mouth.   Multiple Vitamin (MULTIVITAMIN WITH MINERALS) TABS tablet Take 1 tablet by mouth daily.   Omega-3 Fatty Acids (FISH OIL PO) Take 2 capsules by mouth daily.   ondansetron (ZOFRAN-ODT) 4 MG disintegrating tablet Take 1 tablet (4 mg total) by mouth every 8 (eight) hours as needed for nausea or vomiting.    oxyCODONE-acetaminophen (PERCOCET) 5-325 MG tablet Take 1 tablet by mouth every 4 (four) hours as needed for severe pain.   simvastatin (ZOCOR) 20 MG tablet Take 20 mg by mouth daily at 6 PM.    [DISCONTINUED] felodipine (PLENDIL) 5 MG 24 hr tablet Take 5 mg by mouth at bedtime.    [DISCONTINUED] FLUoxetine (PROZAC) 20 MG capsule Take 20-40 mg by mouth every morning. USUALLY JUST TAKES 1 BUT WILL TAKE 2 IF NEEDED    No facility-administered encounter medications on file as of 10/28/2021.    Allergies (verified) Patient has no known allergies.   History: Past Medical History:  Diagnosis Date   Arthritis    Hyperlipidemia    Hypertension    Past Surgical History:  Procedure Laterality Date   COLONOSCOPY     Family History  Problem Relation Age of Onset   Breast cancer Neg Hx    Social History   Socioeconomic History   Marital status: Widowed    Spouse name: Not on file   Number of children: Not on file   Years of education: Not on file   Highest education level: Not on file  Occupational History   Not on file  Tobacco Use   Smoking status: Never   Smokeless tobacco: Never  Vaping Use   Vaping Use: Never used  Substance and Sexual Activity   Alcohol use: Yes    Alcohol/week: 14.0 standard drinks of alcohol    Types: 14 Cans of beer per week   Drug use: No   Sexual activity: Not on file  Other Topics Concern   Not on file  Social History Narrative   Not on file   Social Determinants of Health   Financial Resource Strain: Low Risk  (10/28/2021)   Overall Financial Resource Strain (CARDIA)    Difficulty of Paying Living Expenses: Not hard at all  Food Insecurity: No Food Insecurity (10/28/2021)   Hunger Vital Sign    Worried About Running Out of Food in the Last Year: Never true    Ran Out of Food in the Last Year: Never true  Transportation Needs: No Transportation Needs (10/28/2021)   PRAPARE - Hydrologist (Medical): No    Lack  of Transportation (Non-Medical): No  Physical Activity: Sufficiently Active (10/28/2021)   Exercise Vital Sign    Days of Exercise per Week: 4 days    Minutes of Exercise per Session: 40 min  Stress: No Stress Concern Present (10/28/2021)   [place]    Feeling of Stress : Not at all  Social Connections: Socially Isolated (10/28/2021)   Social Connection and Isolation Panel [NHANES]    Frequency of Communication with Friends and Family: More than three times a week    Frequency of Social Gatherings with Friends and Family: More than three times a week    Attends Religious Services: Never    Marine scientist or Organizations: No    Attends Music therapist: Never    Marital Status: Divorced    Tobacco Counseling Counseling given: Not Answered   Clinical Intake:  Pre-visit preparation completed: Yes  Pain : No/denies pain Pain Score: 0-No pain     Nutritional Risks: None Diabetes: No  How often do you need to have someone help you when you read instructions, pamphlets, or other written materials from your doctor or pharmacy?: 1 - Never What is the last grade level you completed in school?: 3 years of college  Diabetic?no  Interpreter Needed?: No  Information entered by :: Lacretia Nicks CMA   Activities of Daily Living    10/28/2021    2:27 PM  In your present state of health, do you have any difficulty performing the following activities:  Hearing? 0  Vision? 0  Difficulty concentrating or making decisions? 0  Walking or climbing stairs? 0  Dressing or bathing? 0  Doing errands, shopping? 0  Preparing Food and eating ? N  Using the Toilet? N  In the past six months, have you accidently leaked urine? N  Do you have problems with loss of bowel control? N  Managing your Medications? N  Managing your Finances? N  Housekeeping or managing your Housekeeping? N    Patient Care  Team: Cletis Athens, MD as PCP - General (Internal Medicine)  Indicate any recent Medical Services you may have received from other than [facility] providers in the past year (date may be approximate).     Assessment:   This is a routine wellness examination for Alejandra Castro.  Hearing/Vision screen No results found.  Dietary issues and exercise activities discussed: Current Exercise Habits: Home exercise routine, Time (Minutes): 40, Frequency (Times/Week): 4, Weekly Exercise (Minutes/Week): 160, Intensity: Mild, Exercise limited by: None identified   Goals Addressed   None  Depression Screen    10/28/2021    2:27 PM 10/28/2021    2:24 PM 08/03/2021   10:08 AM 10/12/2017    1:22 PM  PHQ 2/9 Scores  PHQ - 2 Score 0 0 0 0    Fall Risk    08/03/2021   10:08 AM 10/12/2017    1:22 PM  Fall Risk   Falls in the past year? 0 No  Number falls in past yr: 0   Injury with Fall? 0   Risk for fall due to : No Fall Risks   Follow up Falls evaluation completed     [place]:  Any stairs in or around the home? No  If so, are there any without handrails? No  Home free of loose throw rugs in walkways, pet beds, electrical cords, etc? Yes  Adequate lighting in your home to reduce risk of falls? Yes   ASSISTIVE DEVICES UTILIZED TO PREVENT FALLS:  Life alert? No  Use of a cane, walker or w/c? No  Grab bars in the bathroom? No  Shower chair or bench in shower? No  Elevated toilet seat or a handicapped toilet? No   TIMED UP AND GO:  Was the test performed? No .  Length of time to ambulate - NA     Cognitive Function:    10/28/2021    2:28 PM  MMSE - Mini Mental State Exam  Not completed: Unable to complete        10/28/2021    2:28 PM  6CIT Screen  What Year? 0 points  What month? 0 points  What time? 0 points  Count back from 20 0 points  Months in reverse 0 points  Repeat phrase 0 points  Total Score 0 points    Immunizations Immunization  History  Administered Date(s) Administered   Influenza Inj Mdck Quad With Preservative 01/12/2018   Influenza-Unspecified 12/21/2015, 12/26/2016   PFIZER Comirnaty(Gray Top)Covid-19 Tri-Sucrose Vaccine 04/04/2020, 04/27/2020   Pneumococcal Polysaccharide-23 12/24/2015, 01/04/2021   Tdap 07/11/2007, 06/27/2017   Zoster Recombinat (Shingrix) 12/31/2018, 07/18/2019    TDAP status: Up to date  Flu Vaccine status: Up to date  Pneumococcal vaccine status: Up to date  Covid-19 vaccine status: Completed vaccines  Qualifies for Shingles Vaccine? Yes   Zostavax completed Yes   Shingrix Completed?: Yes  Screening Tests Health Maintenance  Topic Date Due   Hepatitis C Screening  Never done   DEXA SCAN  Never done   COVID-19 Vaccine (3 - Pfizer risk series) 05/25/2020   Pneumonia Vaccine 36+ Years old (2 - PCV) 01/04/2022   INFLUENZA VACCINE  11/08/2021   MAMMOGRAM  02/16/2022   COLONOSCOPY (Pts 45-48yr Insurance coverage will need to be confirmed)  06/06/2027   TETANUS/TDAP  06/28/2027   Zoster Vaccines- Shingrix  Completed   HPV VACCINES  Aged Out    Health Maintenance  Health Maintenance Due  Topic Date Due   Hepatitis C Screening  Never done   DEXA SCAN  Never done   COVID-19 Vaccine (3 - Pfizer risk series) 05/25/2020   Pneumonia Vaccine 68 Years old (2 - PCV) 01/04/2022    Colonoscopy completed 06/05/2017  Mammogram status: Completed 11/092021. Repeat every year   Additional Screening:  Hepatitis C Screening: does qualify; Complete with next labs  Vision Screening: Recommended annual ophthalmology exams for early detection of glaucoma and other disorders of the eye. Is the patient up to date with their annual eye exam?  Yes  Who is the provider or what is the name of the office in which the patient attends annual eye exams?[facility] Annamaria Helling If pt is not established with a provider, would they like to be referred to a provider to establish care? No .    Dental Screening: Recommended annual dental exams  Community Resource Referral / Chronic Care Management: CRR required this visit?  No   CCM required this visit?  No      Plan:     I have personally reviewed and noted the following in the patient's chart:   Medical and social history Use of alcohol, tobacco or illicit drugs  Current medications and supplements including opioid prescriptions. Patient is currently taking opioid prescriptions. Information provided to patient regarding non-opioid alternatives. Patient advised to discuss non-opioid treatment plan with their provider. Functional ability and status Nutritional status Physical activity Advanced directives List of other physicians Hospitalizations, surgeries, and ER visits in previous 12 months Vitals Screenings to include cognitive, depression, and falls Referrals and appointments  In addition, I have reviewed and discussed with patient certain preventive protocols, quality metrics, and best practice recommendations. A written personalized care plan for preventive services as well as general preventive health recommendations were provided to patient.     Lacretia Nicks, [place]   10/28/2021   Nurse Notes:  Ms. Shubert , Thank you for taking time to come for your Medicare Wellness Visit. I appreciate your ongoing commitment to your health goals. Please review the following plan we discussed and let me know if I can assist you in the future.   These are the goals we discussed:  Goals   None     This is a list of the screening recommended for you and due dates:  Health Maintenance  Topic Date Due   Hepatitis C Screening: USPSTF Recommendation to screen - Ages 33-79 yo.  Never done   DEXA scan (bone density measurement)  Never done   COVID-19 Vaccine (3 - Pfizer risk series) 05/25/2020   Pneumonia Vaccine (2 - PCV) 01/04/2022   Flu Shot  11/08/2021   Mammogram  02/16/2022   Colon Cancer Screening  06/06/2027    Tetanus Vaccine  06/28/2027   Zoster (Shingles) Vaccine  Completed   HPV Vaccine  Aged Out     I have reviewed and agreed to the above documentation.   Theresia Lo, FNP

## 2021-10-31 ENCOUNTER — Encounter: Payer: Self-pay | Admitting: Internal Medicine

## 2021-11-01 ENCOUNTER — Ambulatory Visit (INDEPENDENT_AMBULATORY_CARE_PROVIDER_SITE_OTHER): Payer: Medicare Other

## 2021-11-01 DIAGNOSIS — G479 Sleep disorder, unspecified: Secondary | ICD-10-CM | POA: Diagnosis not present

## 2021-11-02 LAB — CORTISOL: Cortisol, Plasma: 27.1 ug/dL — ABNORMAL HIGH

## 2021-11-10 ENCOUNTER — Ambulatory Visit: Payer: Medicare Other | Admitting: Nurse Practitioner

## 2021-11-17 ENCOUNTER — Encounter: Payer: Self-pay | Admitting: Nurse Practitioner

## 2021-11-17 ENCOUNTER — Ambulatory Visit (INDEPENDENT_AMBULATORY_CARE_PROVIDER_SITE_OTHER): Payer: Medicare Other | Admitting: Nurse Practitioner

## 2021-11-17 VITALS — BP 131/83 | HR 76 | Ht 66.0 in | Wt 185.6 lb

## 2021-11-17 DIAGNOSIS — G4700 Insomnia, unspecified: Secondary | ICD-10-CM

## 2021-11-17 DIAGNOSIS — R7989 Other specified abnormal findings of blood chemistry: Secondary | ICD-10-CM

## 2021-11-17 DIAGNOSIS — I10 Essential (primary) hypertension: Secondary | ICD-10-CM

## 2021-11-17 DIAGNOSIS — F419 Anxiety disorder, unspecified: Secondary | ICD-10-CM | POA: Diagnosis not present

## 2021-11-17 MED ORDER — ALPRAZOLAM 0.25 MG PO TABS: 0.2500 mg | ORAL_TABLET | Freq: Every evening | ORAL | 0 refills | Status: DC | PRN

## 2021-11-17 NOTE — Progress Notes (Unsigned)
Established Patient Office Visit  Subjective:  Patient ID: Alejandra Castro, female    DOB: [birth year]  Age: 66 y.o. MRN: [id number]  CC:  Chief Complaint  Patient presents with   lab results     HPI  Alejandra Castro presents for sleep issues since 2018. She is using cpap. She had tried  [place] in past but it did not help. She wants to try something else for sleep.    HPI   Past Medical History:  Diagnosis Date   Arthritis    Hyperlipidemia    Hypertension     Past Surgical History:  Procedure Laterality Date   COLONOSCOPY      Family History  Problem Relation Age of Onset   Breast cancer Neg Hx     Social History   Socioeconomic History   Marital status: Widowed    Spouse name: Not on file   Number of children: Not on file   Years of education: Not on file   Highest education level: Not on file  Occupational History   Not on file  Tobacco Use   Smoking status: Never   Smokeless tobacco: Never  Vaping Use   Vaping Use: Never used  Substance and Sexual Activity   Alcohol use: Yes    Alcohol/week: 14.0 standard drinks of alcohol    Types: 14 Cans of beer per week   Drug use: No   Sexual activity: Not on file  Other Topics Concern   Not on file  Social History Narrative   Not on file   Social Determinants of Health   Financial Resource Strain: Low Risk  (10/28/2021)   Overall Financial Resource Strain (CARDIA)    Difficulty of Paying Living Expenses: Not hard at all  Food Insecurity: No Food Insecurity (10/28/2021)   Hunger Vital Sign    Worried About Running Out of Food in the Last Year: Never true    Ran Out of Food in the Last Year: Never true  Transportation Needs: No Transportation Needs (10/28/2021)   PRAPARE - Hydrologist (Medical): No    Lack of Transportation (Non-Medical): No  Physical Activity: Sufficiently Active (10/28/2021)   Exercise Vital Sign    Days of Exercise per Week: 4 days    Minutes of  Exercise per Session: 40 min  Stress: No Stress Concern Present (10/28/2021)   [place]    Feeling of Stress : Not at all  Social Connections: Socially Isolated (10/28/2021)   Social Connection and Isolation Panel [NHANES]    Frequency of Communication with Friends and Family: More than three times a week    Frequency of Social Gatherings with Friends and Family: More than three times a week    Attends Religious Services: Never    Marine scientist or Organizations: No    Attends Archivist Meetings: Never    Marital Status: Divorced  Human resources officer Violence: Not At Risk (10/28/2021)   Humiliation, Afraid, Rape, and Kick questionnaire    Fear of Current or Ex-Partner: No    Emotionally Abused: No    Physically Abused: No    Sexually Abused: No     Outpatient Medications Prior to Visit  Medication Sig Dispense Refill   albuterol (VENTOLIN HFA) 108 (90 Base) MCG/ACT inhaler Inhale into the lungs.     amLODipine (NORVASC) 5 MG tablet Take 1 tablet (5 mg total) by mouth daily. [place]  tablet 1   aspirin EC 81 MG tablet Take 81 mg by mouth daily.     Calcium Carb-Cholecalciferol 600-10 MG-MCG TABS Take by mouth.     fluticasone (FLONASE) 50 MCG/ACT nasal spray Place into the nose.     lisinopril (ZESTRIL) 40 MG tablet Take by mouth.     magnesium oxide (MAG-OX) 400 MG tablet Take by mouth.     meclizine (ANTIVERT) 25 MG tablet Take by mouth.     metoprolol succinate (TOPROL-XL) 100 MG 24 hr tablet Take by mouth.     Multiple Vitamin (MULTIVITAMIN WITH MINERALS) TABS tablet Take 1 tablet by mouth daily.     Omega-3 Fatty Acids (FISH OIL PO) Take 2 capsules by mouth daily.     ondansetron (ZOFRAN-ODT) 4 MG disintegrating tablet Take 1 tablet (4 mg total) by mouth every 8 (eight) hours as needed for nausea or vomiting. 12 tablet 0   oxyCODONE-acetaminophen (PERCOCET) 5-325 MG tablet Take 1 tablet by mouth every 4  (four) hours as needed for severe pain. 12 tablet 0   simvastatin (ZOCOR) 20 MG tablet Take 20 mg by mouth daily at 6 PM.      No facility-administered medications prior to visit.    No Known Allergies  ROS Review of Systems    Objective:    Physical Exam  BP 131/83   Pulse 76   Ht _0  (1.676 m)   Wt 185 lb 9.6 oz (84.2 kg)   BMI 29.96 kg/m  Wt Readings from Last 3 Encounters:  11/17/21 185 lb 9.6 oz (84.2 kg)  10/11/21 179 lb (81.2 kg)  09/10/21 185 lb (83.9 kg)     Health Maintenance  Topic Date Due   Hepatitis C Screening  Never done   DEXA SCAN  Never done   COVID-19 Vaccine (3 - Pfizer risk series) 05/25/2020   INFLUENZA VACCINE  11/08/2021   Pneumonia Vaccine 52+ Years old (2 - PCV) 01/04/2022   MAMMOGRAM  02/16/2022   COLONOSCOPY (Pts 45-26yr Insurance coverage will need to be confirmed)  06/06/2027   TETANUS/TDAP  06/28/2027   Zoster Vaccines- Shingrix  Completed   HPV VACCINES  Aged Out    There are no preventive care reminders to display for this patient.  Lab Results  Component Value Date   TSH 2.712 08/31/2021   Lab Results  Component Value Date   WBC 12.0 (H) 10/11/2021   HGB 13.1 10/11/2021   HCT 37.9 10/11/2021   MCV 87.1 10/11/2021   PLT 279 10/11/2021   Lab Results  Component Value Date   NA 131 (L) 10/11/2021   K 3.5 10/11/2021   CO2 22 10/11/2021   GLUCOSE 113 (H) 10/11/2021   BUN 11 10/11/2021   CREATININE 0.54 10/11/2021   BILITOT 0.6 10/11/2021   ALKPHOS 89 10/11/2021   AST 22 10/11/2021   ALT 17 10/11/2021   PROT 7.7 10/11/2021   ALBUMIN 4.3 10/11/2021   CALCIUM 9.1 10/11/2021   ANIONGAP 11 10/11/2021   EGFR 99 08/22/2021   Lab Results  Component Value Date   CHOL 134 08/22/2021   Lab Results  Component Value Date   HDL 69 08/22/2021   Lab Results  Component Value Date   LDLCALC 50 08/22/2021   Lab Results  Component Value Date   TRIG 73 08/22/2021   Lab Results  Component Value Date   CHOLHDL 1.9  08/22/2021   No results found for: "HGBA1C"    Assessment & Plan:  Problem List Items Addressed This Visit   None    No orders of the defined types were placed in this encounter.    Follow-up: No follow-ups on file.    Theresia Lo, NP

## 2021-11-18 ENCOUNTER — Ambulatory Visit (INDEPENDENT_AMBULATORY_CARE_PROVIDER_SITE_OTHER): Payer: Medicare Other

## 2021-11-18 DIAGNOSIS — R7989 Other specified abnormal findings of blood chemistry: Secondary | ICD-10-CM

## 2021-11-19 LAB — CORTISOL: Cortisol, Plasma: 16.6 ug/dL

## 2021-11-30 DIAGNOSIS — R7989 Other specified abnormal findings of blood chemistry: Secondary | ICD-10-CM | POA: Insufficient documentation

## 2021-11-30 NOTE — Assessment & Plan Note (Addendum)
Cortisol level 27.1. Would repeat series of cortisol level in the morning and evening

## 2021-11-30 NOTE — Assessment & Plan Note (Signed)
Started her on Xanax 0.25 for sleep as needed.

## 2021-11-30 NOTE — Assessment & Plan Note (Signed)
Stable

## 2021-12-02 ENCOUNTER — Ambulatory Visit (INDEPENDENT_AMBULATORY_CARE_PROVIDER_SITE_OTHER): Payer: Medicare Other | Admitting: Nurse Practitioner

## 2021-12-02 ENCOUNTER — Encounter: Payer: Self-pay | Admitting: Nurse Practitioner

## 2021-12-02 VITALS — BP 133/80 | HR 57 | Ht 66.0 in | Wt 184.7 lb

## 2021-12-02 DIAGNOSIS — E663 Overweight: Secondary | ICD-10-CM | POA: Diagnosis not present

## 2021-12-02 DIAGNOSIS — G4700 Insomnia, unspecified: Secondary | ICD-10-CM

## 2021-12-02 NOTE — Progress Notes (Signed)
 Established Patient Office Visit  Subjective:  Patient ID: Alejandra Castro, female    DOB: 03/12/1956  Age: 66 y.o. MRN: [id number]  CC:  Chief Complaint  Patient presents with   Follow-up    2 week follow up after starting xanax. Patient states that is helping a lot and she is sleeping at night now.      HPI  Alejandra Castro presents for follow up on xanax. She is doing better now and is able to sleep. But still have some af the adrenaline rush during day time.  HPI   Past Medical History:  Diagnosis Date   Arthritis    Hyperlipidemia    Hypertension     Past Surgical History:  Procedure Laterality Date   COLONOSCOPY      Family History  Problem Relation Age of Onset   Breast cancer Neg Hx     Social History   Socioeconomic History   Marital status: Widowed    Spouse name: Not on file   Number of children: Not on file   Years of education: Not on file   Highest education level: Not on file  Occupational History   Not on file  Tobacco Use   Smoking status: Never   Smokeless tobacco: Never  Vaping Use   Vaping Use: Never used  Substance and Sexual Activity   Alcohol use: Yes    Alcohol/week: 14.0 standard drinks of alcohol    Types: 14 Cans of beer per week   Drug use: No   Sexual activity: Not on file  Other Topics Concern   Not on file  Social History Narrative   Not on file   Social Determinants of Health   Financial Resource Strain: Low Risk  (10/28/2021)   Overall Financial Resource Strain (CARDIA)    Difficulty of Paying Living Expenses: Not hard at all  Food Insecurity: No Food Insecurity (10/28/2021)   Hunger Vital Sign    Worried About Running Out of Food in the Last Year: Never true    Ran Out of Food in the Last Year: Never true  Transportation Needs: No Transportation Needs (10/28/2021)   PRAPARE - Transportation    Lack of Transportation (Medical): No    Lack of Transportation (Non-Medical): No  Physical Activity:  Sufficiently Active (10/28/2021)   Exercise Vital Sign    Days of Exercise per Week: 4 days    Minutes of Exercise per Session: 40 min  Stress: No Stress Concern Present (10/28/2021)   [employer] - Occupational Stress Questionnaire    Feeling of Stress : Not at all  Social Connections: Socially Isolated (10/28/2021)   Social Connection and Isolation Panel [NHANES]    Frequency of Communication with Friends and Family: More than three times a week    Frequency of Social Gatherings with Friends and Family: More than three times a week    Attends Religious Services: Never    Active Member of Clubs or Organizations: No    Attends Club or Organization Meetings: Never    Marital Status: Divorced  Intimate Partner Violence: Not At Risk (10/28/2021)   Humiliation, Afraid, Rape, and Kick questionnaire    Fear of Current or Ex-Partner: No    Emotionally Abused: No    Physically Abused: No    Sexually Abused: No     Outpatient Medications Prior to Visit  Medication Sig Dispense Refill   albuterol (VENTOLIN HFA) 108 (90 Base) MCG/ACT inhaler Inhale into the lungs.       ALPRAZolam (XANAX) 0.25 MG tablet Take 1 tablet (0.25 mg total) by mouth at bedtime as needed for anxiety. 30 tablet 0   amLODipine (NORVASC) 5 MG tablet Take 1 tablet (5 mg total) by mouth daily. 90 tablet 1   aspirin EC 81 MG tablet Take 81 mg by mouth daily.     Calcium Carb-Cholecalciferol 600-10 MG-MCG TABS Take by mouth.     fluticasone (FLONASE) 50 MCG/ACT nasal spray Place into the nose.     lisinopril (ZESTRIL) 40 MG tablet Take by mouth.     magnesium oxide (MAG-OX) 400 MG tablet Take by mouth.     meclizine (ANTIVERT) 25 MG tablet Take by mouth.     metoprolol succinate (TOPROL-XL) 100 MG 24 hr tablet Take by mouth.     Multiple Vitamin (MULTIVITAMIN WITH MINERALS) TABS tablet Take 1 tablet by mouth daily.     Omega-3 Fatty Acids (FISH OIL PO) Take 2 capsules by mouth daily.     simvastatin  (ZOCOR) 20 MG tablet Take 20 mg by mouth daily at 6 PM.      ondansetron (ZOFRAN-ODT) 4 MG disintegrating tablet Take 1 tablet (4 mg total) by mouth every 8 (eight) hours as needed for nausea or vomiting. 12 tablet 0   oxyCODONE-acetaminophen (PERCOCET) 5-325 MG tablet Take 1 tablet by mouth every 4 (four) hours as needed for severe pain. 12 tablet 0   No facility-administered medications prior to visit.    No Known Allergies  ROS Review of Systems  Constitutional: Negative.   HENT: Negative.    Eyes: Negative.   Respiratory: Negative.    Cardiovascular: Negative.   Gastrointestinal: Negative.   Genitourinary: Negative.   Musculoskeletal: Negative.   Neurological:  Negative for dizziness, facial asymmetry and headaches.  Psychiatric/Behavioral:  Negative for agitation, behavioral problems and confusion.       Objective:    Physical Exam Constitutional:      Appearance: Normal appearance. She is obese.  HENT:     Head: Normocephalic.     Right Ear: Tympanic membrane normal.     Left Ear: Tympanic membrane normal.     Nose: Nose normal.     Mouth/Throat:     Mouth: Mucous membranes are moist.     Pharynx: Oropharynx is clear.  Eyes:     Extraocular Movements: Extraocular movements intact.     Conjunctiva/sclera: Conjunctivae normal.     Pupils: Pupils are equal, round, and reactive to light.  Cardiovascular:     Rate and Rhythm: Normal rate and regular rhythm.     Pulses: Normal pulses.     Heart sounds: Normal heart sounds.  Pulmonary:     Effort: Pulmonary effort is normal. No respiratory distress.     Breath sounds: Normal breath sounds. No rhonchi.  Abdominal:     General: Bowel sounds are normal.     Palpations: Abdomen is soft. There is no mass.     Tenderness: There is no abdominal tenderness.     Hernia: No hernia is present.  Musculoskeletal:        General: Normal range of motion.     Cervical back: Neck supple. No tenderness.  Skin:    General: Skin  is warm.     Capillary Refill: Capillary refill takes less than 2 seconds.  Neurological:     General: No focal deficit present.     Mental Status: She is alert and oriented to person, place, and time. Mental status is at baseline.  Psychiatric:        Mood and Affect: Mood normal.        Behavior: Behavior normal.        Thought Content: Thought content normal.        Judgment: Judgment normal.     BP 133/80   Pulse (!) 57   Ht 5' 6" (1.676 m)   Wt 184 lb 11.2 oz (83.8 kg)   BMI 29.81 kg/m  Wt Readings from Last 3 Encounters:  12/02/21 184 lb 11.2 oz (83.8 kg)  11/17/21 185 lb 9.6 oz (84.2 kg)  10/11/21 179 lb (81.2 kg)     Health Maintenance  Topic Date Due   COVID-19 Vaccine (3 - Pfizer risk series) 05/25/2020   INFLUENZA VACCINE  11/08/2021   Pneumonia Vaccine 75+ Years old (2 - PCV) 01/04/2022   DEXA SCAN  12/03/2022 (Originally 01/25/2019)   Hepatitis C Screening  12/03/2022 (Originally 01/25/1972)   MAMMOGRAM  02/16/2022   COLONOSCOPY (Pts 45-60yr Insurance coverage will need to be confirmed)  06/06/2027   TETANUS/TDAP  06/28/2027   Zoster Vaccines- Shingrix  Completed   HPV VACCINES  Aged Out    There are no preventive care reminders to display for this patient.  Lab Results  Component Value Date   TSH 2.712 08/31/2021   Lab Results  Component Value Date   WBC 12.0 (H) 10/11/2021   HGB 13.1 10/11/2021   HCT 37.9 10/11/2021   MCV 87.1 10/11/2021   PLT 279 10/11/2021   Lab Results  Component Value Date   NA 131 (L) 10/11/2021   K 3.5 10/11/2021   CO2 22 10/11/2021   GLUCOSE 113 (H) 10/11/2021   BUN 11 10/11/2021   CREATININE 0.54 10/11/2021   BILITOT 0.6 10/11/2021   ALKPHOS 89 10/11/2021   AST 22 10/11/2021   ALT 17 10/11/2021   PROT 7.7 10/11/2021   ALBUMIN 4.3 10/11/2021   CALCIUM 9.1 10/11/2021   ANIONGAP 11 10/11/2021   EGFR 99 08/22/2021   Lab Results  Component Value Date   CHOL 134 08/22/2021   Lab Results  Component Value  Date   HDL 69 08/22/2021   Lab Results  Component Value Date   LDLCALC 50 08/22/2021   Lab Results  Component Value Date   TRIG 73 08/22/2021   Lab Results  Component Value Date   CHOLHDL 1.9 08/22/2021   No results found for: "HGBA1C"    Assessment & Plan:   Problem List Items Addressed This Visit       Other   Overweight (BMI 25.0-29.9)    Body mass index is 29.81 kg/m. Advised pt to lose weight. Advised patient to avoid trans fat, fatty and fried food. Follow a regular physical activity schedule.          Insomnia - Primary    She is sleeping good now. Continue Xanax 0.25 mg daily.        No orders of the defined types were placed in this encounter.    Follow-up: Return in about 1 month (around 01/02/2022).    CTheresia Lo NP

## 2021-12-02 NOTE — Addendum Note (Signed)
Addended by: Alois Cliche on: 12/02/2021 03:40 PM   Modules accepted: Orders

## 2021-12-02 NOTE — Assessment & Plan Note (Signed)
Body mass index is 29.81 kg/m. Advised pt to lose weight. Advised patient to avoid trans fat, fatty and fried food. Follow a regular physical activity schedule.

## 2021-12-02 NOTE — Assessment & Plan Note (Signed)
She is sleeping good now. Continue Xanax 0.25 mg daily.

## 2021-12-03 LAB — CORTISOL: Cortisol, Plasma: 7 ug/dL

## 2021-12-23 ENCOUNTER — Ambulatory Visit (INDEPENDENT_AMBULATORY_CARE_PROVIDER_SITE_OTHER): Payer: Medicare Other | Admitting: Nurse Practitioner

## 2021-12-23 ENCOUNTER — Encounter: Payer: Self-pay | Admitting: Nurse Practitioner

## 2021-12-23 VITALS — BP 152/86 | HR 65 | Ht 66.0 in | Wt 186.0 lb

## 2021-12-23 DIAGNOSIS — I10 Essential (primary) hypertension: Secondary | ICD-10-CM

## 2021-12-23 DIAGNOSIS — F419 Anxiety disorder, unspecified: Secondary | ICD-10-CM

## 2021-12-23 DIAGNOSIS — G4733 Obstructive sleep apnea (adult) (pediatric): Secondary | ICD-10-CM | POA: Diagnosis not present

## 2021-12-23 DIAGNOSIS — G4700 Insomnia, unspecified: Secondary | ICD-10-CM | POA: Diagnosis not present

## 2021-12-23 MED ORDER — ALPRAZOLAM 0.25 MG PO TABS: 0.2500 mg | ORAL_TABLET | Freq: Every evening | ORAL | 0 refills | Status: DC | PRN

## 2021-12-23 MED ORDER — SERTRALINE HCL 50 MG PO TABS: 50.0000 mg | ORAL_TABLET | Freq: Every day | ORAL | 3 refills | Status: DC

## 2021-12-23 NOTE — Assessment & Plan Note (Signed)
Patient blood pressure elevated in the office today. Patient states that she is taking amlodipine, lisinopril and metoprolol daily. Advised patient to continue low-salt heart healthy diet and follow regular exercise routine. Advised patient to check blood pressure at home and bring readings at next appointment.

## 2021-12-23 NOTE — Progress Notes (Signed)
Established Patient Office Visit  Subjective:  Patient ID: Alejandra Castro, female    DOB: 1955/05/22  Age: 66 y.o. MRN: [id number]  CC:  Chief Complaint  Patient presents with   Follow-up     HPI  Alejandra Castro presents for follow up on insomnia and anxiety. She is also using CPAP machine since 2020.  Patient states that she was not able to sleep last night.  She has been not able to calm down her anxiety.  She would like to start on some other anxiety medication.  She would like to wean off from Xanax.  HPI   Past Medical History:  Diagnosis Date   Arthritis    Hyperlipidemia    Hypertension     Past Surgical History:  Procedure Laterality Date   COLONOSCOPY      Family History  Problem Relation Age of Onset   Breast cancer Neg Hx     Social History   Socioeconomic History   Marital status: Widowed    Spouse name: Not on file   Number of children: Not on file   Years of education: Not on file   Highest education level: Not on file  Occupational History   Not on file  Tobacco Use   Smoking status: Never   Smokeless tobacco: Never  Vaping Use   Vaping Use: Never used  Substance and Sexual Activity   Alcohol use: Yes    Alcohol/week: 14.0 standard drinks of alcohol    Types: 14 Cans of beer per week   Drug use: No   Sexual activity: Not on file  Other Topics Concern   Not on file  Social History Narrative   Not on file   Social Determinants of Health   Financial Resource Strain: Low Risk  (10/28/2021)   Overall Financial Resource Strain (CARDIA)    Difficulty of Paying Living Expenses: Not hard at all  Food Insecurity: No Food Insecurity (10/28/2021)   Hunger Vital Sign    Worried About Running Out of Food in the Last Year: Never true    Ran Out of Food in the Last Year: Never true  Transportation Needs: No Transportation Needs (10/28/2021)   PRAPARE - Hydrologist (Medical): No    Lack of Transportation  (Non-Medical): No  Physical Activity: Sufficiently Active (10/28/2021)   Exercise Vital Sign    Days of Exercise per Week: 4 days    Minutes of Exercise per Session: 40 min  Stress: No Stress Concern Present (10/28/2021)   [place]    Feeling of Stress : Not at all  Social Connections: Socially Isolated (10/28/2021)   Social Connection and Isolation Panel [NHANES]    Frequency of Communication with Friends and Family: More than three times a week    Frequency of Social Gatherings with Friends and Family: More than three times a week    Attends Religious Services: Never    Marine scientist or Organizations: No    Attends Archivist Meetings: Never    Marital Status: Divorced  Human resources officer Violence: Not At Risk (10/28/2021)   Humiliation, Afraid, Rape, and Kick questionnaire    Fear of Current or Ex-Partner: No    Emotionally Abused: No    Physically Abused: No    Sexually Abused: No     Outpatient Medications Prior to Visit  Medication Sig Dispense Refill   albuterol (VENTOLIN HFA) 108 (90  Base) MCG/ACT inhaler Inhale into the lungs.     amLODipine (NORVASC) 5 MG tablet Take 1 tablet (5 mg total) by mouth daily. 90 tablet 1   aspirin EC 81 MG tablet Take 81 mg by mouth daily.     Calcium Carb-Cholecalciferol 600-10 MG-MCG TABS Take by mouth.     fluticasone (FLONASE) 50 MCG/ACT nasal spray Place into the nose.     lisinopril (ZESTRIL) 40 MG tablet Take by mouth.     magnesium oxide (MAG-OX) 400 MG tablet Take by mouth.     meclizine (ANTIVERT) 25 MG tablet Take by mouth.     metoprolol succinate (TOPROL-XL) 100 MG 24 hr tablet Take by mouth.     Multiple Vitamin (MULTIVITAMIN WITH MINERALS) TABS tablet Take 1 tablet by mouth daily.     Omega-3 Fatty Acids (FISH OIL PO) Take 2 capsules by mouth daily.     simvastatin (ZOCOR) 20 MG tablet Take 20 mg by mouth daily at 6 PM.      ALPRAZolam (XANAX)  0.25 MG tablet Take 1 tablet (0.25 mg total) by mouth at bedtime as needed for anxiety. 30 tablet 0   No facility-administered medications prior to visit.    No Known Allergies  ROS Review of Systems  Constitutional: Negative.   HENT: Negative.    Eyes: Negative.   Respiratory:  Negative for chest tightness and shortness of breath.   Cardiovascular:  Negative for chest pain and palpitations.  Gastrointestinal: Negative.   Genitourinary: Negative.   Musculoskeletal: Negative.   Neurological: Negative.   Psychiatric/Behavioral:  Negative for agitation, behavioral problems and confusion.       Objective:    Physical Exam Constitutional:      Appearance: Normal appearance. She is obese.  HENT:     Head: Normocephalic and atraumatic.     Right Ear: Tympanic membrane normal.     Nose: Nose normal.     Mouth/Throat:     Mouth: Mucous membranes are moist.  Eyes:     Pupils: Pupils are equal, round, and reactive to light.  Cardiovascular:     Rate and Rhythm: Normal rate and regular rhythm.  Pulmonary:     Effort: Pulmonary effort is normal.  Abdominal:     General: Bowel sounds are normal. There is no distension.     Palpations: Abdomen is soft.     Tenderness: There is no rebound.  Musculoskeletal:        General: No swelling or tenderness.     Cervical back: Normal range of motion.  Skin:    General: Skin is warm.     Capillary Refill: Capillary refill takes less than 2 seconds.  Neurological:     General: No focal deficit present.     Mental Status: She is alert and oriented to person, place, and time. Mental status is at baseline.  Psychiatric:        Mood and Affect: Mood normal.        Behavior: Behavior normal.        Thought Content: Thought content normal.        Judgment: Judgment normal.     BP (!) 152/86   Pulse 65   Ht _0  (1.676 m)   Wt 186 lb (84.4 kg)   BMI 30.02 kg/m  Wt Readings from Last 3 Encounters:  12/23/21 186 lb (84.4 kg)   12/02/21 184 lb 11.2 oz (83.8 kg)  11/17/21 185 lb 9.6 oz (84.2 kg)  Health Maintenance  Topic Date Due   COVID-19 Vaccine (3 - Pfizer risk series) 05/25/2020   INFLUENZA VACCINE  11/08/2021   Pneumonia Vaccine 95+ Years old (2 - PCV) 01/04/2022   DEXA SCAN  12/03/2022 (Originally 01/25/2019)   Hepatitis C Screening  12/03/2022 (Originally 01/25/1972)   MAMMOGRAM  02/16/2022   COLONOSCOPY (Pts 45-85yr Insurance coverage will need to be confirmed)  06/06/2027   TETANUS/TDAP  06/28/2027   Zoster Vaccines- Shingrix  Completed   HPV VACCINES  Aged Out    There are no preventive care reminders to display for this patient.  Lab Results  Component Value Date   TSH 2.712 08/31/2021   Lab Results  Component Value Date   WBC 12.0 (H) 10/11/2021   HGB 13.1 10/11/2021   HCT 37.9 10/11/2021   MCV 87.1 10/11/2021   PLT 279 10/11/2021   Lab Results  Component Value Date   NA 131 (L) 10/11/2021   K 3.5 10/11/2021   CO2 22 10/11/2021   GLUCOSE 113 (H) 10/11/2021   BUN 11 10/11/2021   CREATININE 0.54 10/11/2021   BILITOT 0.6 10/11/2021   ALKPHOS 89 10/11/2021   AST 22 10/11/2021   ALT 17 10/11/2021   PROT 7.7 10/11/2021   ALBUMIN 4.3 10/11/2021   CALCIUM 9.1 10/11/2021   ANIONGAP 11 10/11/2021   EGFR 99 08/22/2021   Lab Results  Component Value Date   CHOL 134 08/22/2021   Lab Results  Component Value Date   HDL 69 08/22/2021   Lab Results  Component Value Date   LDLCALC 50 08/22/2021   Lab Results  Component Value Date   TRIG 73 08/22/2021   Lab Results  Component Value Date   CHOLHDL 1.9 08/22/2021   No results found for: "HGBA1C"    Assessment & Plan:   Problem List Items Addressed This Visit       Cardiovascular and Mediastinum   Essential hypertension    Patient blood pressure elevated in the office today. Patient states that she is taking amlodipine, lisinopril and metoprolol daily. Advised patient to continue low-salt heart healthy diet  and follow regular exercise routine. Advised patient to check blood pressure at home and bring readings at next appointment.        Other   Anxiety - Primary    Her GAD 7 score is 12. We will start her on Zoloft 50 mg once a day. Encouraged patient to perform deep breathing exercises, meditation and visualization. We will continue to monitor.      Relevant Medications   sertraline (ZOLOFT) 50 MG tablet   ALPRAZolam (XANAX) 0.25 MG tablet   Insomnia    Advised patient to take 1/2 tablet of xnanx every night before going to bed. Refill Xanax.       Relevant Medications   ALPRAZolam (XANAX) 0.25 MG tablet     Meds ordered this encounter  Medications   sertraline (ZOLOFT) 50 MG tablet    Sig: Take 1 tablet (50 mg total) by mouth daily.    Dispense:  30 tablet    Refill:  3   ALPRAZolam (XANAX) 0.25 MG tablet    Sig: Take 1 tablet (0.25 mg total) by mouth at bedtime as needed for anxiety.    Dispense:  30 tablet    Refill:  0     Follow-up: Return in about 1 month (around 01/22/2022).    CTheresia Lo NP

## 2021-12-23 NOTE — Assessment & Plan Note (Signed)
Her GAD 7 score is 12. We will start her on Zoloft 50 mg once a day. Encouraged patient to perform deep breathing exercises, meditation and visualization. We will continue to monitor.

## 2021-12-23 NOTE — Assessment & Plan Note (Addendum)
Advised patient to take 1/2 tablet of xnanx every night before going to bed. Refill Xanax.

## 2022-01-19 ENCOUNTER — Other Ambulatory Visit: Payer: Self-pay

## 2022-01-20 ENCOUNTER — Encounter: Payer: Self-pay | Admitting: Nurse Practitioner

## 2022-01-20 ENCOUNTER — Ambulatory Visit (INDEPENDENT_AMBULATORY_CARE_PROVIDER_SITE_OTHER): Payer: Medicare Other | Admitting: Nurse Practitioner

## 2022-01-20 DIAGNOSIS — G4700 Insomnia, unspecified: Secondary | ICD-10-CM | POA: Diagnosis not present

## 2022-01-20 MED ORDER — ALPRAZOLAM 0.25 MG PO TABS: 0.2500 mg | ORAL_TABLET | Freq: Every evening | ORAL | 0 refills | Status: DC | PRN

## 2022-01-20 NOTE — Progress Notes (Signed)
Established Patient Office Visit  Subjective:  Patient ID: Alejandra Castro, female    DOB: 09-06-1955  Age: 66 y.o. MRN: [id number]  CC:  Chief Complaint  Patient presents with   Follow-up     HPI  Alejandra Castro presents for follow up on insomnia and anxiety.  Her insomnia and anxiety is better controlled with Xanax and Zoloft.  No other complaints at present.  HPI   Past Medical History:  Diagnosis Date   Arthritis    Hyperlipidemia    Hypertension     Past Surgical History:  Procedure Laterality Date   COLONOSCOPY      Family History  Problem Relation Age of Onset   Breast cancer Neg Hx     Social History   Socioeconomic History   Marital status: Widowed    Spouse name: Not on file   Number of children: Not on file   Years of education: Not on file   Highest education level: Not on file  Occupational History   Not on file  Tobacco Use   Smoking status: Never   Smokeless tobacco: Never  Vaping Use   Vaping Use: Never used  Substance and Sexual Activity   Alcohol use: Yes    Alcohol/week: 14.0 standard drinks of alcohol    Types: 14 Cans of beer per week   Drug use: No   Sexual activity: Not on file  Other Topics Concern   Not on file  Social History Narrative   Not on file   Social Determinants of Health   Financial Resource Strain: Low Risk  (10/28/2021)   Overall Financial Resource Strain (CARDIA)    Difficulty of Paying Living Expenses: Not hard at all  Food Insecurity: No Food Insecurity (10/28/2021)   Hunger Vital Sign    Worried About Running Out of Food in the Last Year: Never true    Ran Out of Food in the Last Year: Never true  Transportation Needs: No Transportation Needs (10/28/2021)   PRAPARE - Hydrologist (Medical): No    Lack of Transportation (Non-Medical): No  Physical Activity: Sufficiently Active (10/28/2021)   Exercise Vital Sign    Days of Exercise per Week: 4 days    Minutes of  Exercise per Session: 40 min  Stress: No Stress Concern Present (10/28/2021)   [place]    Feeling of Stress : Not at all  Social Connections: Socially Isolated (10/28/2021)   Social Connection and Isolation Panel [NHANES]    Frequency of Communication with Friends and Family: More than three times a week    Frequency of Social Gatherings with Friends and Family: More than three times a week    Attends Religious Services: Never    Marine scientist or Organizations: No    Attends Archivist Meetings: Never    Marital Status: Divorced  Human resources officer Violence: Not At Risk (10/28/2021)   Humiliation, Afraid, Rape, and Kick questionnaire    Fear of Current or Ex-Partner: No    Emotionally Abused: No    Physically Abused: No    Sexually Abused: No     Outpatient Medications Prior to Visit  Medication Sig Dispense Refill   amLODipine (NORVASC) 5 MG tablet Take 1 tablet (5 mg total) by mouth daily. 90 tablet 1   aspirin EC 81 MG tablet Take 81 mg by mouth daily.     Calcium Carb-Cholecalciferol 600-10 MG-MCG  TABS Take by mouth.     Cholecalciferol 50 MCG (2000 UT) TABS Take by mouth.     lisinopril (ZESTRIL) 40 MG tablet Take by mouth.     magnesium oxide (MAG-OX) 400 MG tablet Take by mouth.     meclizine (ANTIVERT) 25 MG tablet Take by mouth.     Multiple Vitamin (MULTIVITAMIN WITH MINERALS) TABS tablet Take 1 tablet by mouth daily.     Omega-3 Fatty Acids (FISH OIL PO) Take 2 capsules by mouth daily.     simvastatin (ZOCOR) 20 MG tablet Take 20 mg by mouth daily at 6 PM.      albuterol (VENTOLIN HFA) 108 (90 Base) MCG/ACT inhaler Inhale into the lungs.     ALPRAZolam (XANAX) 0.25 MG tablet Take 1 tablet (0.25 mg total) by mouth at bedtime as needed for anxiety. 30 tablet 0   eszopiclone (LUNESTA) 2 MG TABS tablet Take by mouth.     fluticasone (FLONASE) 50 MCG/ACT nasal spray Place into the nose.      metoprolol succinate (TOPROL-XL) 100 MG 24 hr tablet Take by mouth.     sertraline (ZOLOFT) 50 MG tablet Take 1 tablet (50 mg total) by mouth daily. 30 tablet 3   No facility-administered medications prior to visit.    No Known Allergies  ROS Review of Systems  Constitutional: Negative.   HENT: Negative.    Eyes: Negative.   Respiratory:  Negative for chest tightness and shortness of breath.   Cardiovascular:  Negative for chest pain and palpitations.  Gastrointestinal: Negative.   Genitourinary: Negative.   Musculoskeletal: Negative.   Neurological: Negative.   Psychiatric/Behavioral:  Negative for agitation, behavioral problems and confusion.       Objective:    Physical Exam Constitutional:      Appearance: Normal appearance. She is obese.  HENT:     Head: Normocephalic and atraumatic.     Right Ear: Tympanic membrane normal.     Nose: Nose normal.     Mouth/Throat:     Mouth: Mucous membranes are moist.  Eyes:     Pupils: Pupils are equal, round, and reactive to light.  Cardiovascular:     Rate and Rhythm: Normal rate and regular rhythm.  Pulmonary:     Effort: Pulmonary effort is normal.  Abdominal:     General: Bowel sounds are normal. There is no distension.     Palpations: Abdomen is soft.     Tenderness: There is no rebound.  Musculoskeletal:        General: No swelling or tenderness.     Cervical back: Normal range of motion.  Skin:    General: Skin is warm.     Capillary Refill: Capillary refill takes less than 2 seconds.  Neurological:     General: No focal deficit present.     Mental Status: She is alert and oriented to person, place, and time. Mental status is at baseline.  Psychiatric:        Mood and Affect: Mood normal.        Behavior: Behavior normal.        Thought Content: Thought content normal.        Judgment: Judgment normal.     BP 118/78   Pulse (!) 58   Ht 5' 6" (1.676 m)   Wt 189 lb 11.2 oz (86 kg)   BMI 30.62 kg/m  Wt  Readings from Last 3 Encounters:  01/23/22 189 lb (85.7 kg)  01/20/22 189 lb 11.2  oz (86 kg)  12/23/21 186 lb (84.4 kg)     Health Maintenance  Topic Date Due   COVID-19 Vaccine (3 - Pfizer risk series) 05/25/2020   Pneumonia Vaccine 20+ Years old (2 - PCV) 01/04/2022   DEXA SCAN  12/03/2022 (Originally 01/25/2019)   Hepatitis C Screening  12/03/2022 (Originally 01/25/1972)   INFLUENZA VACCINE  02/08/2023 (Originally 11/08/2021)   MAMMOGRAM  02/16/2022   Medicare Annual Wellness (AWV)  10/29/2022   COLONOSCOPY (Pts 45-36yr Insurance coverage will need to be confirmed)  06/06/2027   TETANUS/TDAP  06/28/2027   Zoster Vaccines- Shingrix  Completed   HPV VACCINES  Aged Out    There are no preventive care reminders to display for this patient.  Lab Results  Component Value Date   TSH 2.712 08/31/2021   Lab Results  Component Value Date   WBC 12.0 (H) 10/11/2021   HGB 13.1 10/11/2021   HCT 37.9 10/11/2021   MCV 87.1 10/11/2021   PLT 279 10/11/2021   Lab Results  Component Value Date   NA 131 (L) 10/11/2021   K 3.5 10/11/2021   CO2 22 10/11/2021   GLUCOSE 113 (H) 10/11/2021   BUN 11 10/11/2021   CREATININE 0.54 10/11/2021   BILITOT 0.6 10/11/2021   ALKPHOS 89 10/11/2021   AST 22 10/11/2021   ALT 17 10/11/2021   PROT 7.7 10/11/2021   ALBUMIN 4.3 10/11/2021   CALCIUM 9.1 10/11/2021   ANIONGAP 11 10/11/2021   EGFR 99 08/22/2021   Lab Results  Component Value Date   CHOL 134 08/22/2021   Lab Results  Component Value Date   HDL 69 08/22/2021   Lab Results  Component Value Date   LDLCALC 50 08/22/2021   Lab Results  Component Value Date   TRIG 73 08/22/2021   Lab Results  Component Value Date   CHOLHDL 1.9 08/22/2021   No results found for: "HGBA1C"    Assessment & Plan:   Problem List Items Addressed This Visit       Other   Insomnia    Stable on medication. Refill Xanax 0.25 mg      Relevant Medications   ALPRAZolam (XANAX) 0.25 MG tablet     Meds ordered this encounter  Medications   ALPRAZolam (XANAX) 0.25 MG tablet    Sig: Take 1 tablet (0.25 mg total) by mouth at bedtime as needed for anxiety.    Dispense:  30 tablet    Refill:  0     Follow-up: No follow-ups on file.    CTheresia Lo NP

## 2022-01-23 ENCOUNTER — Other Ambulatory Visit: Payer: Self-pay

## 2022-01-23 ENCOUNTER — Ambulatory Visit: Payer: Medicare Other | Admitting: Gastroenterology

## 2022-01-23 ENCOUNTER — Encounter: Payer: Self-pay | Admitting: Gastroenterology

## 2022-01-23 VITALS — BP 188/98 | HR 74 | Temp 98.6°F | Ht 66.0 in | Wt 189.0 lb

## 2022-01-23 DIAGNOSIS — K5732 Diverticulitis of large intestine without perforation or abscess without bleeding: Secondary | ICD-10-CM

## 2022-01-23 MED ORDER — CLENPIQ 10-3.5-12 MG-GM -GM/175ML PO SOLN: 175.0000 mL | Freq: Once | ORAL | 0 refills | Status: AC

## 2022-01-23 NOTE — Progress Notes (Signed)
Alejandra Darby, MD [address]  [place]  [place], [place] [address]  Main: [phone]  Fax: [phone]    Gastroenterology Consultation  Referring Provider:     Cletis Athens, MD Primary Care Physician:  Cletis Athens, MD Primary Gastroenterologist:  Dr. Cephas Castro Reason for Consultation: History of sigmoid diverticulitis        HPI:   Alejandra Castro is a 66 y.o. female referred by Dr. Cletis Athens, MD  for consultation & management of sigmoid diverticulitis.  Patient had an attack of acute sigmoid diverticulitis in June 2023.  She was treated with antibiotics.  Month later, in early July she returned to the ER with recurrent left lower quadrant pain, underwent CT abdomen and pelvis which confirmed persistent acute sigmoid diverticulitis without evidence of perforation or abscess.  She was originally treated with Cipro and Flagyl, followed by course of Augmentin in July.  Patient has been asymptomatic since these episodes.  She was going through significant stress as well as insomnia during summer which she thinks have triggered these episodes.  She is following healthy diet and stays active.  She does not smoke or drink alcohol, does not consume red meat  Her father was diagnosed with colon cancer in early 60s  NSAIDs: None  Antiplts/Anticoagulants/Anti thrombotics: None  GI Procedures: Colonoscopy 06/05/2017, reportedly normal  Past Medical History:  Diagnosis Date   Arthritis    Hyperlipidemia    Hypertension     Past Surgical History:  Procedure Laterality Date   COLONOSCOPY       Current Outpatient Medications:    albuterol (VENTOLIN HFA) 108 (90 Base) MCG/ACT inhaler, Inhale into the lungs., Disp: , Rfl:    ALPRAZolam (XANAX) 0.25 MG tablet, Take 1 tablet (0.25 mg total) by mouth at bedtime as needed for anxiety., Disp: 30 tablet, Rfl: 0   amLODipine (NORVASC) 5 MG tablet, Take 1 tablet (5 mg total) by mouth daily., Disp: 90 tablet, Rfl: 1    aspirin EC 81 MG tablet, Take 81 mg by mouth daily., Disp: , Rfl:    Calcium Carb-Cholecalciferol 600-10 MG-MCG TABS, Take by mouth., Disp: , Rfl:    Cholecalciferol 50 MCG (2000 UT) TABS, Take by mouth., Disp: , Rfl:    fluticasone (FLONASE) 50 MCG/ACT nasal spray, Place into the nose., Disp: , Rfl:    lisinopril (ZESTRIL) 40 MG tablet, Take by mouth., Disp: , Rfl:    magnesium oxide (MAG-OX) 400 MG tablet, Take by mouth., Disp: , Rfl:    meclizine (ANTIVERT) 25 MG tablet, Take by mouth., Disp: , Rfl:    metoprolol succinate (TOPROL-XL) 100 MG 24 hr tablet, Take by mouth., Disp: , Rfl:    Multiple Vitamin (MULTIVITAMIN WITH MINERALS) TABS tablet, Take 1 tablet by mouth daily., Disp: , Rfl:    Omega-3 Fatty Acids (FISH OIL PO), Take 2 capsules by mouth daily., Disp: , Rfl:    sertraline (ZOLOFT) 50 MG tablet, Take 1 tablet (50 mg total) by mouth daily., Disp: 30 tablet, Rfl: 3   simvastatin (ZOCOR) 20 MG tablet, Take 20 mg by mouth daily at 6 PM. , Disp: , Rfl:    Family History  Problem Relation Age of Onset   Breast cancer Neg Hx      Social History   Tobacco Use   Smoking status: Never   Smokeless tobacco: Never  Vaping Use   Vaping Use: Never used  Substance Use Topics   Alcohol use: Yes  Alcohol/week: 14.0 standard drinks of alcohol    Types: 14 Cans of beer per week   Drug use: No    Allergies as of 01/23/2022   (No Known Allergies)    Review of Systems:    All systems reviewed and negative except where noted in HPI.   Physical Exam:  BP (!) 168/96 (BP Location: Left Arm, Patient Position: Sitting, Cuff Size: Normal)   Pulse (!) 55   Temp 98.6 F (37 C) (Oral)   Ht '5\' 6"'$  (1.676 m)   Wt 189 lb (85.7 kg)   BMI 30.51 kg/m  No LMP recorded. Patient is postmenopausal.  General:   Alert,  Well-developed, well-nourished, pleasant and cooperative in NAD Head:  Normocephalic and atraumatic. Eyes:  Sclera clear, no icterus.   Conjunctiva pink. Ears:  Normal  auditory acuity. Nose:  No deformity, discharge, or lesions. Mouth:  No deformity or lesions,oropharynx pink & moist. Neck:  Supple; no masses or thyromegaly. Lungs:  Respirations even and unlabored.  Clear throughout to auscultation.   No wheezes, crackles, or rhonchi. No acute distress. Heart:  Regular rate and rhythm; no murmurs, clicks, rubs, or gallops. Abdomen:  Normal bowel sounds. Soft, non-tender and non-distended without masses, hepatosplenomegaly or hernias noted.  No guarding or rebound tenderness.   Rectal: Not performed Msk:  Symmetrical without gross deformities. Good, equal movement & strength bilaterally. Pulses:  Normal pulses noted. Extremities:  No clubbing or edema.  No cyanosis. Neurologic:  Alert and oriented x3;  grossly normal neurologically. Skin:  Intact without significant lesions or rashes. No jaundice. Psych:  Alert and cooperative. Normal mood and affect.  Imaging Studies: Reviewed  Assessment and Plan:   [name] is a 66 y.o. female with history of sigmoid diverticulitis in June and July, treated with 2 rounds of antibiotics, family history of colon cancer in first-degree relative in early 41s  Discussed about management of uncomplicated acute diverticulitis  Recommend colonoscopy to rule out any underlying malignancy  I have discussed alternative options, risks & benefits,  which include, but are not limited to, bleeding, infection, perforation,respiratory complication & drug reaction.  The patient agrees with this plan & written consent will be obtained.     Follow up as needed   Alejandra Darby, MD

## 2022-02-02 ENCOUNTER — Other Ambulatory Visit: Payer: Self-pay | Admitting: Internal Medicine

## 2022-02-02 ENCOUNTER — Encounter: Payer: Self-pay | Admitting: Nurse Practitioner

## 2022-02-02 MED ORDER — ALBUTEROL SULFATE HFA 108 (90 BASE) MCG/ACT IN AERS: 2.0000 | INHALATION_SPRAY | RESPIRATORY_TRACT | 6 refills | Status: AC | PRN

## 2022-02-02 MED ORDER — FLUTICASONE PROPIONATE 50 MCG/ACT NA SUSP: 2.0000 | Freq: Every day | NASAL | 6 refills | Status: AC

## 2022-02-08 NOTE — Assessment & Plan Note (Signed)
Stable on medication. Refill Xanax 0.25 mg

## 2022-02-09 ENCOUNTER — Other Ambulatory Visit: Payer: Self-pay | Admitting: Internal Medicine

## 2022-02-16 ENCOUNTER — Encounter
Admission: RE | Disposition: A | Discharge status: Home / Self Care | Payer: Self-pay | Source: Home / Self Care | Attending: Gastroenterology

## 2022-02-16 ENCOUNTER — Ambulatory Visit
Admission: RE | Admit: 2022-02-16 | Discharge: 2022-02-16 | Disposition: A | Discharge status: Home / Self Care | Payer: Medicare Other | Attending: Gastroenterology | Admitting: Gastroenterology

## 2022-02-16 ENCOUNTER — Ambulatory Visit: Payer: Medicare Other | Admitting: Anesthesiology

## 2022-02-16 ENCOUNTER — Other Ambulatory Visit: Payer: Self-pay

## 2022-02-16 DIAGNOSIS — I10 Essential (primary) hypertension: Secondary | ICD-10-CM | POA: Diagnosis not present

## 2022-02-16 DIAGNOSIS — K5730 Diverticulosis of large intestine without perforation or abscess without bleeding: Secondary | ICD-10-CM | POA: Insufficient documentation

## 2022-02-16 DIAGNOSIS — K6389 Other specified diseases of intestine: Secondary | ICD-10-CM | POA: Insufficient documentation

## 2022-02-16 DIAGNOSIS — E785 Hyperlipidemia, unspecified: Secondary | ICD-10-CM | POA: Insufficient documentation

## 2022-02-16 DIAGNOSIS — K639 Disease of intestine, unspecified: Secondary | ICD-10-CM

## 2022-02-16 DIAGNOSIS — K5732 Diverticulitis of large intestine without perforation or abscess without bleeding: Secondary | ICD-10-CM | POA: Diagnosis not present

## 2022-02-16 DIAGNOSIS — F418 Other specified anxiety disorders: Secondary | ICD-10-CM | POA: Diagnosis not present

## 2022-02-16 HISTORY — PX: COLONOSCOPY WITH PROPOFOL: SHX5780

## 2022-02-16 SURGERY — COLONOSCOPY WITH PROPOFOL
Anesthesia: General

## 2022-02-16 MED ORDER — LIDOCAINE HCL (CARDIAC) PF 100 MG/5ML IV SOSY
PREFILLED_SYRINGE | INTRAVENOUS | Status: DC | PRN
  Administered 2022-02-16: 100 mg via INTRAVENOUS

## 2022-02-16 MED ORDER — GLYCOPYRROLATE 0.2 MG/ML IJ SOLN
INTRAMUSCULAR | Status: AC
  Filled 2022-02-16: qty 1

## 2022-02-16 MED ORDER — PROPOFOL 10 MG/ML IV BOLUS
INTRAVENOUS | Status: AC
  Filled 2022-02-16: qty 20

## 2022-02-16 MED ORDER — PROPOFOL 10 MG/ML IV BOLUS
INTRAVENOUS | Status: DC | PRN
  Administered 2022-02-16: 100 mg via INTRAVENOUS
  Administered 2022-02-16: 120 ug/kg/min via INTRAVENOUS

## 2022-02-16 MED ORDER — SODIUM CHLORIDE 0.9 % IV SOLN: INTRAVENOUS | Status: DC

## 2022-02-16 NOTE — Transfer of Care (Signed)
Immediate Anesthesia Transfer of Care Note  Patient: Alejandra Castro  Procedure(s) Performed: COLONOSCOPY WITH PROPOFOL  Patient Location: PACU  Anesthesia Type:General  Level of Consciousness: awake  Airway & Oxygen Therapy: Patient Spontanous Breathing  Post-op Assessment: Report given to RN and Post -op Vital signs reviewed and stable  Post vital signs: Reviewed and stable  Last Vitals:  Vitals Value Taken Time  BP 127/56 02/16/22 1058  Temp 36.7 C 02/16/22 1058  Pulse 67 02/16/22 1059  Resp 17 02/16/22 1059  SpO2 99 % 02/16/22 1059  Vitals shown include unvalidated device data.  Last Pain:  Vitals:   02/16/22 1058  TempSrc: Temporal  PainSc:          Complications: No notable events documented.

## 2022-02-16 NOTE — Anesthesia Postprocedure Evaluation (Signed)
Anesthesia Post Note  Patient: Alejandra Castro  Procedure(s) Performed: COLONOSCOPY WITH PROPOFOL  Patient location during evaluation: Endoscopy Anesthesia Type: General Level of consciousness: awake and alert Pain management: pain level controlled Vital Signs Assessment: post-procedure vital signs reviewed and stable Respiratory status: spontaneous breathing, nonlabored ventilation, respiratory function stable and patient connected to nasal cannula oxygen Cardiovascular status: blood pressure returned to baseline and stable Postop Assessment: no apparent nausea or vomiting Anesthetic complications: no   No notable events documented.   Last Vitals:  Vitals:   02/16/22 0946 02/16/22 1058  BP: (!) 147/82 (!) 127/56  Pulse: 73   Resp: 16   Temp: 36.9 C 36.7 C  SpO2: 99%     Last Pain:  Vitals:   02/16/22 1058  TempSrc: Temporal  PainSc: 0-No pain                 Arita Miss

## 2022-02-16 NOTE — Op Note (Signed)
[facility] Gastroenterology Patient Name: Alejandra Castro Procedure Date: 02/16/2022 10:31 AM MRN: [id number] Account #: [id number] Date of Birth: 13-Oct-1955 Admit Type: Outpatient Age: 66 Room: [facility] ENDO ROOM 3 Gender: Female Note Status: Finalized Instrument Name: Colonoscope [id number] Procedure:             Colonoscopy Indications:           Last colonoscopy: August 2013, Follow-up of                         diverticulitis Providers:             Lin Landsman MD, MD Referring MD:          Cletis Athens, MD (Referring MD) Medicines:             General Anesthesia Complications:         No immediate complications. Estimated blood loss: None. Procedure:             Pre-Anesthesia Assessment:                        - Prior to the procedure, a History and Physical was                         performed, and patient medications and allergies were                         reviewed. The patient is competent. The risks and                         benefits of the procedure and the sedation options and                         risks were discussed with the patient. All questions                         were answered and informed consent was obtained.                         Patient identification and proposed procedure were                         verified by the physician, the nurse, the                         anesthesiologist, the anesthetist and the technician                         in the pre-procedure area in the procedure room in the                         endoscopy suite. Mental Status Examination: alert and                         oriented. Airway Examination: normal oropharyngeal                         airway and neck mobility. Respiratory Examination:  clear to auscultation. CV Examination: normal.                         Prophylactic Antibiotics: The patient does not require                         prophylactic antibiotics. Prior  Anticoagulants: The                         patient has taken no anticoagulant or antiplatelet                         agents. ASA Grade Assessment: III - A patient with                         severe systemic disease. After reviewing the risks and                         benefits, the patient was deemed in satisfactory                         condition to undergo the procedure. The anesthesia                         plan was to use general anesthesia. Immediately prior                         to administration of medications, the patient was                         re-assessed for adequacy to receive sedatives. The                         heart rate, respiratory rate, oxygen saturations,                         blood pressure, adequacy of pulmonary ventilation, and                         response to care were monitored throughout the                         procedure. The physical status of the patient was                         re-assessed after the procedure.                        After obtaining informed consent, the colonoscope was                         passed under direct vision. Throughout the procedure,                         the patient's blood pressure, pulse, and oxygen                         saturations were monitored continuously. The  Colonoscope was introduced through the anus and                         advanced to the the cecum, identified by appendiceal                         orifice and ileocecal valve. The colonoscopy was                         performed without difficulty. The patient tolerated                         the procedure well. The quality of the bowel                         preparation was good. The ileocecal valve, appendiceal                         orifice, and rectum were photographed. Findings:      The perianal and digital rectal examinations were normal. Pertinent       negatives include normal sphincter tone and no  palpable rectal lesions.      Multiple large-mouthed diverticula were found in the recto-sigmoid colon       and sigmoid colon. There was no evidence of diverticular bleeding.      A localized area of mildly erythematous mucosa was found in the       recto-sigmoid colon, peridiverticular pattern. Biopsies were taken with       a cold forceps for histology.      The retroflexed view of the distal rectum and anal verge was normal and       showed no anal or rectal abnormalities. Impression:            - Severe diverticulosis in the recto-sigmoid colon and                         in the sigmoid colon. There was no evidence of                         diverticular bleeding.                        - Erythematous mucosa in the recto-sigmoid colon.                         Biopsied.                        - The distal rectum and anal verge are normal on                         retroflexion view. Recommendation:        - Discharge patient to home (with escort).                        - Resume previous diet and high fiber diet for the                         rest of the patient's life.                        -  Continue present medications. Procedure Code(s):     --- Professional ---                        [phone], Colonoscopy, flexible; with biopsy, single or                         multiple Diagnosis Code(s):     --- Professional ---                        K63.89, Other specified diseases of intestine                        K57.32, Diverticulitis of large intestine without                         perforation or abscess without bleeding                        K57.30, Diverticulosis of large intestine without                         perforation or abscess without bleeding CPT copyright 2022 American [employer] Association. All rights reserved. The codes documented in this report are preliminary and upon coder review may  be revised to meet current compliance requirements. Dr. Ulyess Mort Lin Landsman MD, MD 02/16/2022 10:56:43 AM This report has been signed electronically. Number of Addenda: 0 Note Initiated On: 02/16/2022 10:31 AM Scope Withdrawal Time: 0 hours 9 minutes 5 seconds  Total Procedure Duration: 0 hours 12 minutes 2 seconds  Estimated Blood Loss:  Estimated blood loss: none.      [facility]

## 2022-02-16 NOTE — H&P (Signed)
Cephas Darby, MD [address]  [place], [place] [address]  Main: [phone]  Fax: [phone] Pager: [phone]  Primary Care Physician:  Cletis Athens, MD Primary Gastroenterologist:  Dr. Cephas Darby  Pre-Procedure History & Physical: HPI:  Alejandra Castro is a 66 y.o. female is here for an colonoscopy.   Past Medical History:  Diagnosis Date   Arthritis    Hyperlipidemia    Hypertension     Past Surgical History:  Procedure Laterality Date   COLONOSCOPY      Prior to Admission medications   Medication Sig Start Date End Date Taking? Authorizing Provider  albuterol (VENTOLIN HFA) 108 (90 Base) MCG/ACT inhaler Inhale 2 puffs into the lungs every 4 (four) hours as needed for wheezing or shortness of breath. 02/02/22  Yes Masoud, Viann Shove, MD  ALPRAZolam Duanne Moron) 0.25 MG tablet Take 1 tablet (0.25 mg total) by mouth at bedtime as needed for anxiety. 01/20/22  Yes Theresia Lo, NP  amLODipine (NORVASC) 5 MG tablet TAKE 1 TABLET(5 MG) BY MOUTH DAILY 02/09/22  Yes Masoud, Viann Shove, MD  aspirin EC 81 MG tablet Take 81 mg by mouth daily.   Yes [provider]  Calcium Carb-Cholecalciferol 600-10 MG-MCG TABS Take by mouth.   Yes [provider]  Cholecalciferol 50 MCG (2000 UT) TABS Take by mouth.   Yes [provider]  fluticasone (FLONASE) 50 MCG/ACT nasal spray Place 2 sprays into both nostrils daily. 02/02/22  Yes Masoud, Viann Shove, MD  lisinopril (ZESTRIL) 40 MG tablet Take by mouth. 01/04/21  Yes [provider]  magnesium oxide (MAG-OX) 400 MG tablet Take by mouth.   Yes [provider]  meclizine (ANTIVERT) 25 MG tablet Take by mouth. 03/07/21  Yes [provider]  metoprolol succinate (TOPROL-XL) 100 MG 24 hr tablet Take by mouth. 01/04/21 01/23/22 Yes [provider]  Multiple Vitamin (MULTIVITAMIN WITH MINERALS) TABS tablet Take 1 tablet by mouth daily.   Yes [provider]   Omega-3 Fatty Acids (FISH OIL PO) Take 2 capsules by mouth daily.   Yes [provider]  simvastatin (ZOCOR) 20 MG tablet Take 20 mg by mouth daily at 6 PM.    Yes [provider]  sertraline (ZOLOFT) 50 MG tablet Take 1 tablet (50 mg total) by mouth daily. Patient not taking: Reported on 02/16/2022 12/23/21   Theresia Lo, NP  felodipine (PLENDIL) 5 MG 24 hr tablet Take 5 mg by mouth at bedtime.   12/02/19  [provider]  FLUoxetine (PROZAC) 20 MG capsule Take 20-40 mg by mouth every morning. USUALLY JUST TAKES 1 BUT WILL TAKE 2 IF NEEDED  08/31/16 12/02/19  [provider]    Allergies as of 01/23/2022   (No Known Allergies)    Family History  Problem Relation Age of Onset   Breast cancer Neg Hx     Social History   Socioeconomic History   Marital status: Widowed    Spouse name: Not on file   Number of children: Not on file   Years of education: Not on file   Highest education level: Not on file  Occupational History   Not on file  Tobacco Use   Smoking status: Never   Smokeless tobacco: Never  Vaping Use   Vaping Use: Never used  Substance and Sexual Activity   Alcohol use: Yes    Alcohol/week: 14.0 standard drinks of alcohol    Types: 14 Cans of beer per week  Drug use: No   Sexual activity: Not on file  Other Topics Concern   Not on file  Social History Narrative   Not on file   Social Determinants of Health   Financial Resource Strain: Low Risk  (10/28/2021)   Overall Financial Resource Strain (CARDIA)    Difficulty of Paying Living Expenses: Not hard at all  Food Insecurity: No Food Insecurity (10/28/2021)   Hunger Vital Sign    Worried About Running Out of Food in the Last Year: Never true    Ran Out of Food in the Last Year: Never true  Transportation Needs: No Transportation Needs (10/28/2021)   PRAPARE - Hydrologist (Medical): No    Lack of Transportation (Non-Medical): No  Physical  Activity: Sufficiently Active (10/28/2021)   Exercise Vital Sign    Days of Exercise per Week: 4 days    Minutes of Exercise per Session: 40 min  Stress: No Stress Concern Present (10/28/2021)   [place]    Feeling of Stress : Not at all  Social Connections: Socially Isolated (10/28/2021)   Social Connection and Isolation Panel [NHANES]    Frequency of Communication with Friends and Family: More than three times a week    Frequency of Social Gatherings with Friends and Family: More than three times a week    Attends Religious Services: Never    Marine scientist or Organizations: No    Attends Archivist Meetings: Never    Marital Status: Divorced  Human resources officer Violence: Not At Risk (10/28/2021)   Humiliation, Afraid, Rape, and Kick questionnaire    Fear of Current or Ex-Partner: No    Emotionally Abused: No    Physically Abused: No    Sexually Abused: No    Review of Systems: See HPI, otherwise negative ROS  Physical Exam: BP (!) 147/82   Pulse 73   Temp 98.4 F (36.9 C) (Temporal)   Resp 16   Ht '5\' 6"'$  (1.676 m)   Wt 82.6 kg   SpO2 99%   BMI 29.38 kg/m  General:   Alert,  pleasant and cooperative in NAD Head:  Normocephalic and atraumatic. Neck:  Supple; no masses or thyromegaly. Lungs:  Clear throughout to auscultation.    Heart:  Regular rate and rhythm. Abdomen:  Soft, nontender and nondistended. Normal bowel sounds, without guarding, and without rebound.   Neurologic:  Alert and  oriented x4;  grossly normal neurologically.  Impression/Plan: Alejandra Castro is here for an colonoscopy to be performed for h/o uncomplicated acute diverticulitis   Risks, benefits, limitations, and alternatives regarding  colonoscopy have been reviewed with the patient.  Questions have been answered.  All parties agreeable.   Sherri Sear, MD  02/16/2022, 10:22 AM

## 2022-02-16 NOTE — Anesthesia Preprocedure Evaluation (Signed)
Anesthesia Evaluation  Patient identified by MRN, date of birth, ID band Patient awake    Reviewed: Allergy & Precautions, NPO status , Patient's Chart, lab work & pertinent test results  History of Anesthesia Complications Negative for: history of anesthetic complications  Airway Mallampati: II  TM Distance: >3 FB Neck ROM: Full    Dental no notable dental hx. (+) Teeth Intact   Pulmonary sleep apnea and Continuous Positive Airway Pressure Ventilation , neg COPD, Patient abstained from smoking.Not current smoker   Pulmonary exam normal breath sounds clear to auscultation       Cardiovascular Exercise Tolerance: Good METShypertension, Pt. on medications (-) CAD and (-) Past MI (-) dysrhythmias  Rhythm:Regular Rate:Normal - Systolic murmurs    Neuro/Psych  PSYCHIATRIC DISORDERS Anxiety Depression    negative neurological ROS  negative psych ROS   GI/Hepatic ,neg GERD  ,,(+)     (-) substance abuse    Endo/Other  neg diabetes    Renal/GU negative Renal ROS     Musculoskeletal   Abdominal   Peds  Hematology   Anesthesia Other Findings Past Medical History: No date: Arthritis No date: Hyperlipidemia No date: Hypertension  Reproductive/Obstetrics                             Anesthesia Physical Anesthesia Plan  ASA: 2  Anesthesia Plan: General   Post-op Pain Management: Minimal or no pain anticipated   Induction: Intravenous  PONV Risk Score and Plan: 3 and Propofol infusion, TIVA and Ondansetron  Airway Management Planned: Nasal Cannula  Additional Equipment: None  Intra-op Plan:   Post-operative Plan:   Informed Consent: I have reviewed the patients History and Physical, chart, labs and discussed the procedure including the risks, benefits and alternatives for the proposed anesthesia with the patient or authorized representative who has indicated his/her understanding and  acceptance.     Dental advisory given  Plan Discussed with: CRNA and Surgeon  Anesthesia Plan Comments: (Discussed risks of anesthesia with patient, including possibility of difficulty with spontaneous ventilation under anesthesia necessitating airway intervention, PONV, and rare risks such as cardiac or respiratory or neurological events, and allergic reactions. Discussed the role of CRNA in patient's perioperative care. Patient understands.)       Anesthesia Quick Evaluation

## 2022-02-17 ENCOUNTER — Encounter: Payer: Self-pay | Admitting: Gastroenterology

## 2022-02-17 LAB — SURGICAL PATHOLOGY

## 2022-02-18 ENCOUNTER — Other Ambulatory Visit: Payer: Self-pay | Admitting: Nurse Practitioner

## 2022-02-18 DIAGNOSIS — G4700 Insomnia, unspecified: Secondary | ICD-10-CM

## 2022-02-20 MED ORDER — ALPRAZOLAM 0.25 MG PO TABS: 0.2500 mg | ORAL_TABLET | Freq: Every evening | ORAL | 0 refills | Status: DC | PRN

## 2022-03-13 ENCOUNTER — Ambulatory Visit (INDEPENDENT_AMBULATORY_CARE_PROVIDER_SITE_OTHER): Payer: Medicare Other | Admitting: Internal Medicine

## 2022-03-13 VITALS — BP 130/81 | HR 63 | Resp 16 | Ht 66.0 in | Wt 186.0 lb

## 2022-03-13 DIAGNOSIS — I10 Essential (primary) hypertension: Secondary | ICD-10-CM | POA: Diagnosis not present

## 2022-03-13 DIAGNOSIS — Z7189 Other specified counseling: Secondary | ICD-10-CM

## 2022-03-13 DIAGNOSIS — G4733 Obstructive sleep apnea (adult) (pediatric): Secondary | ICD-10-CM | POA: Diagnosis not present

## 2022-03-13 NOTE — Patient Instructions (Signed)

## 2022-03-13 NOTE — Progress Notes (Signed)
[facility] [place], [place] [address]  Pulmonary Sleep Medicine   Office Visit Note  Patient Name: Alejandra Castro DOB: 11-Dec-1955 MRN [id number]    Chief Complaint: Obstructive Sleep Apnea visit  Brief History:  Alejandra Castro is seen today for an annual follow up on CPAP at 8 cmh20. The patient has a 5 year history of sleep apnea. Patient is using PAP nightly.  The patient feels rested after sleeping with PAP.  The patient reports benefiting from PAP use. Reported sleepiness is  improved and the Epworth Sleepiness Score is 9 out of 24. The patient does not take naps. The patient complains of the following: No complaints.  She has some insomnia issues that she takes Xanax for. Her machine has reached end of life and she is interested in getting a new machine. The compliance download shows 100% compliance with an average use time of 11 hours. The AHI is 4.5.  The patient does not complain of limb movements disrupting sleep.  ROS  General: (-) fever, (-) chills, (-) night sweat Nose and Sinuses: (-) nasal stuffiness or itchiness, (-) postnasal drip, (-) nosebleeds, (-) sinus trouble. Mouth and Throat: (-) sore throat, (-) hoarseness. Neck: (-) swollen glands, (-) enlarged thyroid, (-) neck pain. Respiratory: - cough, - shortness of breath, - wheezing. Neurologic: - numbness, - tingling. Psychiatric: + anxiety, - depression   Current Medication: Outpatient Encounter Medications as of 03/13/2022  Medication Sig   albuterol (VENTOLIN HFA) 108 (90 Base) MCG/ACT inhaler Inhale 2 puffs into the lungs every 4 (four) hours as needed for wheezing or shortness of breath.   ALPRAZolam (XANAX) 0.25 MG tablet Take 1 tablet (0.25 mg total) by mouth at bedtime as needed for anxiety.   amLODipine (NORVASC) 5 MG tablet TAKE 1 TABLET(5 MG) BY MOUTH DAILY   aspirin EC 81 MG tablet Take 81 mg by mouth daily.   Calcium Carb-Cholecalciferol 600-10 MG-MCG TABS Take by mouth.    Cholecalciferol 50 MCG (2000 UT) TABS Take by mouth.   fluticasone (FLONASE) 50 MCG/ACT nasal spray Place 2 sprays into both nostrils daily.   lisinopril (ZESTRIL) 40 MG tablet Take by mouth.   magnesium oxide (MAG-OX) 400 MG tablet Take by mouth.   meclizine (ANTIVERT) 25 MG tablet Take by mouth.   metoprolol succinate (TOPROL-XL) 100 MG 24 hr tablet Take by mouth.   Multiple Vitamin (MULTIVITAMIN WITH MINERALS) TABS tablet Take 1 tablet by mouth daily.   Omega-3 Fatty Acids (FISH OIL PO) Take 2 capsules by mouth daily.   simvastatin (ZOCOR) 20 MG tablet Take 20 mg by mouth daily at 6 PM.    [DISCONTINUED] felodipine (PLENDIL) 5 MG 24 hr tablet Take 5 mg by mouth at bedtime.    [DISCONTINUED] FLUoxetine (PROZAC) 20 MG capsule Take 20-40 mg by mouth every morning. USUALLY JUST TAKES 1 BUT WILL TAKE 2 IF NEEDED    [DISCONTINUED] sertraline (ZOLOFT) 50 MG tablet Take 1 tablet (50 mg total) by mouth daily. (Patient not taking: Reported on 02/16/2022)   No facility-administered encounter medications on file as of 03/13/2022.    Surgical History: Past Surgical History:  Procedure Laterality Date   COLONOSCOPY     COLONOSCOPY WITH PROPOFOL N/A 02/16/2022   Procedure: COLONOSCOPY WITH PROPOFOL;  Surgeon: Lin Landsman, MD;  Location: [facility] ENDOSCOPY;  Service: Gastroenterology;  Laterality: N/A;    Medical History: Past Medical History:  Diagnosis Date   Arthritis    Hyperlipidemia    Hypertension  Family History: Non contributory to the present illness  Social History: Social History   Socioeconomic History   Marital status: Widowed    Spouse name: Not on file   Number of children: Not on file   Years of education: Not on file   Highest education level: Not on file  Occupational History   Not on file  Tobacco Use   Smoking status: Never   Smokeless tobacco: Never  Vaping Use   Vaping Use: Never used  Substance and Sexual Activity   Alcohol use: Yes    Alcohol/week:  14.0 standard drinks of alcohol    Types: 14 Cans of beer per week   Drug use: No   Sexual activity: Not on file  Other Topics Concern   Not on file  Social History Narrative   Not on file   Social Determinants of Health   Financial Resource Strain: Low Risk  (10/28/2021)   Overall Financial Resource Strain (CARDIA)    Difficulty of Paying Living Expenses: Not hard at all  Food Insecurity: No Food Insecurity (10/28/2021)   Hunger Vital Sign    Worried About Running Out of Food in the Last Year: Never true    Ran Out of Food in the Last Year: Never true  Transportation Needs: No Transportation Needs (10/28/2021)   PRAPARE - Hydrologist (Medical): No    Lack of Transportation (Non-Medical): No  Physical Activity: Sufficiently Active (10/28/2021)   Exercise Vital Sign    Days of Exercise per Week: 4 days    Minutes of Exercise per Session: 40 min  Stress: No Stress Concern Present (10/28/2021)   [place]    Feeling of Stress : Not at all  Social Connections: Socially Isolated (10/28/2021)   Social Connection and Isolation Panel [NHANES]    Frequency of Communication with Friends and Family: More than three times a week    Frequency of Social Gatherings with Friends and Family: More than three times a week    Attends Religious Services: Never    Marine scientist or Organizations: No    Attends Archivist Meetings: Never    Marital Status: Divorced  Human resources officer Violence: Not At Risk (10/28/2021)   Humiliation, Afraid, Rape, and Kick questionnaire    Fear of Current or Ex-Partner: No    Emotionally Abused: No    Physically Abused: No    Sexually Abused: No    Vital Signs: Blood pressure 130/81, pulse 63, resp. rate 16, height '5\' 6"'$  (1.676 m), weight 186 lb (84.4 kg), SpO2 97 %. Body mass index is 30.02 kg/m.    Examination: General Appearance: The patient is  well-developed, well-nourished, and in no distress. Neck Circumference: 39 cm Skin: Gross inspection of skin unremarkable. Head: normocephalic, no gross deformities. Eyes: no gross deformities noted. ENT: ears appear grossly normal Neurologic: Alert and oriented. No involuntary movements.  STOP BANG RISK ASSESSMENT S (snore) Have you been told that you snore?     NO   T (tired) Are you often tired, fatigued, or sleepy during the day?   NO  O (obstruction) Do you stop breathing, choke, or gasp during sleep? NO   P (pressure) Do you have or are you being treated for high blood pressure? YES   B (BMI) Is your body index greater than 35 kg/m? NO   A (age) Are you 83 years old or older? YES  N (neck) Do you have a neck circumference greater than 16 inches?   NO   G (gender) Are you a female? NO   TOTAL STOP/BANG "YES" ANSWERS 2       A STOP-Bang score of 2 or less is considered low risk, and a score of 5 or more is high risk for having either moderate or severe OSA. For people who score 3 or 4, doctors may need to perform further assessment to determine how likely they are to have OSA.         EPWORTH SLEEPINESS SCALE:  Scale:  (0)= no chance of dozing; (1)= slight chance of dozing; (2)= moderate chance of dozing; (3)= high chance of dozing  Chance  Situtation    Sitting and reading: 1    Watching TV: 1    Sitting Inactive in public: 1    As a passenger in car: 2      Lying down to rest: 1    Sitting and talking: 1    Sitting quielty after lunch: 1    In a car, stopped in traffic: 1   TOTAL SCORE:   9 out of 24    SLEEP STUDIES:  SPLIT (02/23/17) AHI 108, min SPO2 85%   CPAP COMPLIANCE DATA:  Date Range: 03/09/21 - 03/08/22  Average Daily Use: 11 hours  Median Use: 11 hours 5 minutes  Compliance for > 4 Hours: 365 days  AHI: 4.5 respiratory events per hour  Days Used: 365/365  Mask Leak: 23.5  95th Percentile Pressure: 8  cmh20         LABS: Recent Results (from the past 2160 hour(s))  Surgical pathology     Status: None   Collection Time: 02/16/22 10:51 AM  Result Value Ref Range   SURGICAL PATHOLOGY      SURGICAL PATHOLOGY CASE: [id number] PATIENT: Alejandra Castro Surgical Pathology Report     Specimen Submitted: A. Colon, rectosigmoid; cbx  Clinical History: Sigmoid diverticulitis K57.32.  Diverticulosis. Rectosigmoid colon erythema      DIAGNOSIS: A.  RECTOSIGMOID COLON BIOPSIES: - COLONIC MUCOSA WITH MILD EDEMA MILD MUCOSAL HYPERPLASIA AND FOCAL NONSPECIFIC VASCULAR CONGESTION. - NO EVIDENCE OF A SPECIFIC COLITIS. - NO ATYPIA.   GROSS DESCRIPTION: A. Labeled: Cbx rectosigmoid colon erythema Received: Formalin Collection time: 10:51 AM on 02/16/2022 Placed into formalin time: 10:51 AM on 02/16/2022 Tissue fragment(s): 1 Size: 0.5 x 0.3 x 0.1 cm Description: Tan soft tissue fragment Entirely submitted in 1 cassette.  CM 02/16/2022  Final Diagnosis performed by Theodora Blow, MD.   Electronically signed 02/17/2022 9:35:04AM The electronic signature indicates that the named Attending Pathologist has evaluated the specimen Technical component perfo rmed at [employer], [address], [place], [place] [address] Lab: [phone] Dir: Rush Farmer, MD, MMM  Professional component performed at [facility], [facility] [place], [place], [place] [address] Lab: [phone] Dir: Kathi Simpers, MD     Radiology: No results found.  No results found.  No results found.    Assessment and Plan: Patient Active Problem List   Diagnosis Date Noted   Sigmoid diverticulitis 02/16/2022   Erythema of colon 02/16/2022   Abnormal cortisol level 11/30/2021   Abnormal CBC 08/24/2021   Abnormal TSH 08/24/2021   Depression 03/14/2021   Mixed hyperlipidemia 03/14/2021   Insomnia 03/14/2021   Vaginal dryness 01/04/2021   CPAP use counseling 03/15/2020    Overweight (BMI 25.0-29.9) 03/15/2020   Osteopenia 07/15/2019   Breast calcification, left 08/07/2017  Anxiety 06/27/2017   OSA on CPAP 02/23/2017   History of adenomatous polyp of colon 12/24/2014   Vertigo 12/17/2014   Benign essential hypertension 12/10/2013   Asthma without status asthmaticus 12/10/2013   Essential hypertension 12/10/2013   Menopause syndrome 12/10/2013   1. OSA on CPAP The patient does tolerate PAP and reports  benefit from PAP use. Machine has reached end of life and must be replaced. The patient was reminded how to clean equipment and advised to replace supplies routinely. The patient was also counselled on weight loss. The compliance is excellent. The AHI is 4.5.   OSA on cpap- replace machine. Cpap continues to be medically necessary to treat this patient's OSA.  F/u 30 d after setup.    2. CPAP use counseling CPAP Counseling: had a lengthy discussion with the patient regarding the importance of PAP therapy in management of the sleep apnea. Patient appears to understand the risk factor reduction and also understands the risks associated with untreated sleep apnea. Patient will try to make a good faith effort to remain compliant with therapy. Also instructed the patient on proper cleaning of the device including the water must be changed daily if possible and use of distilled water is preferred. Patient understands that the machine should be regularly cleaned with appropriate recommended cleaning solutions that do not damage the PAP machine for example given white vinegar and water rinses. Other methods such as ozone treatment may not be as good as these simple methods to achieve cleaning.   3. Benign essential hypertension Hypertension Counseling:   The following hypertensive lifestyle modification were recommended and discussed:  1. Limiting alcohol intake to less than 1 oz/day of ethanol:(24 oz of beer or 8 oz of wine or 2 oz of 100-proof whiskey). 2. Take baby  ASA 81 mg daily. 3. Importance of regular aerobic exercise and losing weight. 4. Reduce dietary saturated fat and cholesterol intake for overall cardiovascular health. 5. Maintaining adequate dietary potassium, calcium, and magnesium intake. 6. Regular monitoring of the blood pressure. 7. Reduce sodium intake to less than 100 mmol/day (less than 2.3 gm of sodium or less than 6 gm of sodium choride)      General Counseling: I have discussed the findings of the evaluation and examination with Alejandra Castro.  I have also discussed any further diagnostic evaluation thatmay be needed or ordered today. Alejandra Castro verbalizes understanding of the findings of todays visit. We also reviewed her medications today and discussed drug interactions and side effects including but not limited excessive drowsiness and altered mental states. We also discussed that there is always a risk not just to her but also people around her. she has been encouraged to call the office with any questions or concerns that should arise related to todays visit.  No orders of the defined types were placed in this encounter.       I have personally obtained a history, examined the patient, evaluated laboratory and imaging results, formulated the assessment and plan and placed orders. This patient was seen today by Alejandra Ellis, PA-C in collaboration with Dr. Devona Konig.   Allyne Gee, MD [facility] Diplomate ABMS Pulmonary Critical Care Medicine and Sleep Medicine

## 2022-03-15 ENCOUNTER — Other Ambulatory Visit: Payer: Self-pay | Admitting: Internal Medicine

## 2022-03-16 ENCOUNTER — Other Ambulatory Visit: Payer: Self-pay

## 2022-03-16 MED ORDER — LISINOPRIL 40 MG PO TABS
40.0000 mg | ORAL_TABLET | Freq: Every day | ORAL | 1 refills | Status: DC
  Filled 2022-03-16: qty 90, 90d supply, fill #0
  Filled 2022-06-12: qty 90, 90d supply, fill #1

## 2022-03-16 MED ORDER — METOPROLOL SUCCINATE ER 100 MG PO TB24
100.0000 mg | ORAL_TABLET | Freq: Every day | ORAL | 1 refills | Status: DC
  Filled 2022-03-16: qty 90, 90d supply, fill #0
  Filled 2022-06-12: qty 90, 90d supply, fill #1

## 2022-03-20 ENCOUNTER — Other Ambulatory Visit: Payer: Self-pay | Admitting: Nurse Practitioner

## 2022-03-20 DIAGNOSIS — G4700 Insomnia, unspecified: Secondary | ICD-10-CM

## 2022-03-20 MED ORDER — ALPRAZOLAM 0.25 MG PO TABS
0.2500 mg | ORAL_TABLET | Freq: Every evening | ORAL | 0 refills | Status: DC | PRN
  Filled 2022-03-20: qty 30, 30d supply, fill #0

## 2022-03-21 ENCOUNTER — Other Ambulatory Visit: Payer: Self-pay

## 2022-03-24 DIAGNOSIS — G4733 Obstructive sleep apnea (adult) (pediatric): Secondary | ICD-10-CM | POA: Diagnosis not present

## 2022-03-28 ENCOUNTER — Other Ambulatory Visit: Payer: Self-pay | Admitting: Internal Medicine

## 2022-03-29 ENCOUNTER — Other Ambulatory Visit: Payer: Self-pay

## 2022-03-29 MED ORDER — SIMVASTATIN 20 MG PO TABS
20.0000 mg | ORAL_TABLET | Freq: Every day | ORAL | 0 refills | Status: DC
  Filled 2022-03-29: qty 90, 90d supply, fill #0

## 2022-04-17 ENCOUNTER — Other Ambulatory Visit: Payer: Self-pay | Admitting: Nurse Practitioner

## 2022-04-17 DIAGNOSIS — G4700 Insomnia, unspecified: Secondary | ICD-10-CM

## 2022-04-17 MED ORDER — ALPRAZOLAM 0.25 MG PO TABS: 0.2500 mg | ORAL_TABLET | Freq: Every evening | ORAL | 0 refills | Status: DC | PRN

## 2022-04-25 DIAGNOSIS — H5213 Myopia, bilateral: Secondary | ICD-10-CM | POA: Diagnosis not present

## 2022-05-02 DIAGNOSIS — G4733 Obstructive sleep apnea (adult) (pediatric): Secondary | ICD-10-CM | POA: Diagnosis not present

## 2022-05-12 ENCOUNTER — Other Ambulatory Visit: Payer: Self-pay | Admitting: Internal Medicine

## 2022-05-16 DIAGNOSIS — H52223 Regular astigmatism, bilateral: Secondary | ICD-10-CM | POA: Diagnosis not present

## 2022-05-18 ENCOUNTER — Other Ambulatory Visit: Payer: Self-pay

## 2022-05-18 MED ORDER — ALPRAZOLAM 0.25 MG PO TABS
0.2500 mg | ORAL_TABLET | Freq: Every evening | ORAL | 3 refills | Status: DC | PRN
  Filled 2022-05-18: qty 30, 30d supply, fill #0
  Filled 2022-06-15: qty 30, 30d supply, fill #1
  Filled 2022-07-16: qty 30, 30d supply, fill #2

## 2022-06-02 DIAGNOSIS — G4733 Obstructive sleep apnea (adult) (pediatric): Secondary | ICD-10-CM | POA: Diagnosis not present

## 2022-06-12 ENCOUNTER — Other Ambulatory Visit: Payer: Self-pay

## 2022-06-15 ENCOUNTER — Other Ambulatory Visit: Payer: Self-pay

## 2022-06-20 ENCOUNTER — Ambulatory Visit (INDEPENDENT_AMBULATORY_CARE_PROVIDER_SITE_OTHER): Payer: Medicare Other | Admitting: Nurse Practitioner

## 2022-06-20 ENCOUNTER — Encounter: Payer: Self-pay | Admitting: Nurse Practitioner

## 2022-06-20 ENCOUNTER — Other Ambulatory Visit: Payer: Self-pay

## 2022-06-20 VITALS — BP 168/93 | HR 74 | Temp 98.9°F | Resp 16 | Ht 66.0 in | Wt 185.6 lb

## 2022-06-20 DIAGNOSIS — M8580 Other specified disorders of bone density and structure, unspecified site: Secondary | ICD-10-CM | POA: Diagnosis not present

## 2022-06-20 DIAGNOSIS — E782 Mixed hyperlipidemia: Secondary | ICD-10-CM | POA: Diagnosis not present

## 2022-06-20 DIAGNOSIS — I10 Essential (primary) hypertension: Secondary | ICD-10-CM | POA: Diagnosis not present

## 2022-06-20 DIAGNOSIS — N951 Menopausal and female climacteric states: Secondary | ICD-10-CM

## 2022-06-20 DIAGNOSIS — Z1231 Encounter for screening mammogram for malignant neoplasm of breast: Secondary | ICD-10-CM

## 2022-06-20 DIAGNOSIS — E2839 Other primary ovarian failure: Secondary | ICD-10-CM

## 2022-06-20 MED ORDER — SIMVASTATIN 20 MG PO TABS
20.0000 mg | ORAL_TABLET | Freq: Every day | ORAL | 0 refills | Status: DC
  Filled 2022-06-20: qty 90, 90d supply, fill #0

## 2022-06-20 MED ORDER — LISINOPRIL 40 MG PO TABS
40.0000 mg | ORAL_TABLET | Freq: Every day | ORAL | 1 refills | Status: DC
  Filled 2022-06-20 – 2022-09-11 (×2): qty 90, 90d supply, fill #0

## 2022-06-20 MED ORDER — METOPROLOL SUCCINATE ER 100 MG PO TB24
100.0000 mg | ORAL_TABLET | Freq: Every day | ORAL | 1 refills | Status: DC
  Filled 2022-06-20 – 2022-09-11 (×2): qty 90, 90d supply, fill #0
  Filled 2022-12-11: qty 90, 90d supply, fill #1

## 2022-06-20 MED ORDER — AMLODIPINE BESYLATE 5 MG PO TABS
5.0000 mg | ORAL_TABLET | Freq: Every day | ORAL | 1 refills | Status: DC
  Filled 2022-06-20 – 2022-08-14 (×2): qty 90, 90d supply, fill #0

## 2022-06-20 NOTE — Progress Notes (Signed)
[facility] [place], [place] [address]  Internal MEDICINE  Office Visit Note  Patient Name: Alejandra Castro  [id number]  [id number]  Date of Service: 06/20/2022   Complaints/HPI Pt is here for establishment of PCP. Chief Complaint  Patient presents with   Hypertension   New Patient (Initial Visit)    HRT    HPI Alejandra Castro presents for a new patient visit to establish care.  Well-appearing 67 y.o. female with hypertension, OSA, asthma, osteopenia, high cholesterol, anxiety, and depression.  Work: retired from [employer] after 35 years.  Home: live at home with grown son 39 yo  Diet: high protein diet Exercise: hike at [employer] park 1-1.5 hr walking every other day  Tobacco use: none  Alcohol use: rarely  Illicit drug use: none  Routine CRC screening: November 2023 sees dr. Marius Ditch  Routine mammogram: due now .  DEXA scan: due for routine monitoring Labs: due for routine labs in July.  New or worsening pain: none No weight loss medications  Gets pellet H[facility] therapy [facility], Interested in HRT using oral med therapy instead, need baseline labs Nasal CPAP for OSA, sees sara terrell PA-C.  BP is elevated today but has been ok at home. And is on 3 BP medications.     Current Medication: Outpatient Encounter Medications as of 06/20/2022  Medication Sig   albuterol (VENTOLIN HFA) 108 (90 Base) MCG/ACT inhaler Inhale 2 puffs into the lungs every 4 (four) hours as needed for wheezing or shortness of breath.   ALPRAZolam (XANAX) 0.25 MG tablet Take 1 tablet (0.25 mg total) by mouth at bedtime as needed for anxiety.   aspirin EC 81 MG tablet Take 81 mg by mouth daily.   Calcium Carb-Cholecalciferol 600-10 MG-MCG TABS Take by mouth.   Cholecalciferol 50 MCG (2000 UT) TABS Take by mouth.   fluticasone (FLONASE) 50 MCG/ACT nasal spray Place 2 sprays into both nostrils daily.   magnesium oxide (MAG-OX) 400 MG tablet Take by mouth.   meclizine (ANTIVERT) 25 MG  tablet Take by mouth.   Multiple Vitamin (MULTIVITAMIN WITH MINERALS) TABS tablet Take 1 tablet by mouth daily.   Omega-3 Fatty Acids (FISH OIL PO) Take 2 capsules by mouth daily.   [DISCONTINUED] ALPRAZolam (XANAX) 0.25 MG tablet Take 1 tablet (0.25 mg total) by mouth at bedtime as needed for anxiety.   [DISCONTINUED] amLODipine (NORVASC) 5 MG tablet TAKE 1 TABLET(5 MG) BY MOUTH DAILY   [DISCONTINUED] lisinopril (ZESTRIL) 40 MG tablet Take 1 tablet (40 mg total) by mouth daily.   [DISCONTINUED] metoprolol succinate (TOPROL-XL) 100 MG 24 hr tablet Take 1 tablet (100 mg total) by mouth daily.   [DISCONTINUED] simvastatin (ZOCOR) 20 MG tablet Take 1 tablet (20 mg total) by mouth daily at 6 PM.   amLODipine (NORVASC) 5 MG tablet TAKE 1 TABLET(5 MG) BY MOUTH DAILY   lisinopril (ZESTRIL) 40 MG tablet Take 1 tablet (40 mg total) by mouth daily.   metoprolol succinate (TOPROL-XL) 100 MG 24 hr tablet Take 1 tablet (100 mg total) by mouth daily.   simvastatin (ZOCOR) 20 MG tablet Take 1 tablet (20 mg total) by mouth daily at 6 PM.   [DISCONTINUED] felodipine (PLENDIL) 5 MG 24 hr tablet Take 5 mg by mouth at bedtime.    [DISCONTINUED] FLUoxetine (PROZAC) 20 MG capsule Take 20-40 mg by mouth every morning. USUALLY JUST TAKES 1 BUT WILL TAKE 2 IF NEEDED    No facility-administered encounter medications on file as  of 06/20/2022.    Surgical History: Past Surgical History:  Procedure Laterality Date   COLONOSCOPY     COLONOSCOPY WITH PROPOFOL N/A 02/16/2022   Procedure: COLONOSCOPY WITH PROPOFOL;  Surgeon: Lin Landsman, MD;  Location: [facility] ENDOSCOPY;  Service: Gastroenterology;  Laterality: N/A;    Medical History: Past Medical History:  Diagnosis Date   Arthritis    Hyperlipidemia    Hypertension     Family History: Family History  Problem Relation Age of Onset   Breast cancer Neg Hx     Social History   Socioeconomic History   Marital status: Widowed    Spouse name: Not on file    Number of children: Not on file   Years of education: Not on file   Highest education level: Not on file  Occupational History   Not on file  Tobacco Use   Smoking status: Never   Smokeless tobacco: Never  Vaping Use   Vaping Use: Never used  Substance and Sexual Activity   Alcohol use: Yes    Alcohol/week: 14.0 standard drinks of alcohol    Types: 14 Cans of beer per week    Comment: rarely   Drug use: No   Sexual activity: Not Currently  Other Topics Concern   Not on file  Social History Narrative   Not on file   Social Determinants of Health   Financial Resource Strain: Low Risk  (10/28/2021)   Overall Financial Resource Strain (CARDIA)    Difficulty of Paying Living Expenses: Not hard at all  Food Insecurity: No Food Insecurity (10/28/2021)   Hunger Vital Sign    Worried About Running Out of Food in the Last Year: Never true    Ran Out of Food in the Last Year: Never true  Transportation Needs: No Transportation Needs (10/28/2021)   PRAPARE - Hydrologist (Medical): No    Lack of Transportation (Non-Medical): No  Physical Activity: Sufficiently Active (10/28/2021)   Exercise Vital Sign    Days of Exercise per Week: 4 days    Minutes of Exercise per Session: 40 min  Stress: No Stress Concern Present (10/28/2021)   [place]    Feeling of Stress : Not at all  Social Connections: Socially Isolated (10/28/2021)   Social Connection and Isolation Panel [NHANES]    Frequency of Communication with Friends and Family: More than three times a week    Frequency of Social Gatherings with Friends and Family: More than three times a week    Attends Religious Services: Never    Marine scientist or Organizations: No    Attends Archivist Meetings: Never    Marital Status: Divorced  Human resources officer Violence: Not At Risk (10/28/2021)   Humiliation, Afraid, Rape, and Kick  questionnaire    Fear of Current or Ex-Partner: No    Emotionally Abused: No    Physically Abused: No    Sexually Abused: No     Review of Systems  Constitutional:  Negative for chills, fatigue and unexpected weight change.  HENT:  Negative for congestion, rhinorrhea, sneezing and sore throat.   Eyes:  Negative for redness.  Respiratory:  Negative for cough, chest tightness and shortness of breath.   Cardiovascular:  Negative for chest pain and palpitations.  Gastrointestinal:  Negative for abdominal pain, constipation, diarrhea, nausea and vomiting.  Genitourinary:  Negative for dysuria and frequency.  Musculoskeletal:  Negative for arthralgias, back  pain, joint swelling and neck pain.  Skin:  Negative for rash.  Neurological: Negative.  Negative for tremors and numbness.  Hematological:  Negative for adenopathy. Does not bruise/bleed easily.  Psychiatric/Behavioral:  Negative for behavioral problems (Depression), sleep disturbance and suicidal ideas. The patient is not nervous/anxious.     Vital Signs: BP (!) 168/93 Comment: 170/85  Pulse 74   Temp 98.9 F (37.2 C)   Resp 16   Ht 5\' 6"  (1.676 m)   Wt 185 lb 9.6 oz (84.2 kg)   SpO2 97%   BMI 29.96 kg/m    Physical Exam Vitals reviewed.  Constitutional:      General: She is not in acute distress.    Appearance: Normal appearance. She is not ill-appearing.  HENT:     Head: Normocephalic and atraumatic.  Eyes:     Pupils: Pupils are equal, round, and reactive to light.  Cardiovascular:     Rate and Rhythm: Normal rate and regular rhythm.  Pulmonary:     Effort: Pulmonary effort is normal. No respiratory distress.  Neurological:     Mental Status: She is alert and oriented to person, place, and time.  Psychiatric:        Mood and Affect: Mood normal.        Behavior: Behavior normal.       Assessment/Plan: 1. Benign essential hypertension Continue BP medications as prescribed. BP is elevated today but has  been find at home per patient, will reassess at next office visit.  - metoprolol succinate (TOPROL-XL) 100 MG 24 hr tablet; Take 1 tablet (100 mg total) by mouth daily.  Dispense: 90 tablet; Refill: 1 - lisinopril (ZESTRIL) 40 MG tablet; Take 1 tablet (40 mg total) by mouth daily.  Dispense: 90 tablet; Refill: 1 - amLODipine (NORVASC) 5 MG tablet; Take 1 tablet (5 mg total) by mouth daily.  Dispense: 90 tablet; Refill: 1  2. Mixed hyperlipidemia Continue simvastatin as prescribed.  - simvastatin (ZOCOR) 20 MG tablet; Take 1 tablet (20 mg total) by mouth daily at 6 PM.  Dispense: 90 tablet; Refill: 0  3. Osteopenia, unspecified location DEXA scan ordered and routine labs  - DG Bone Density; Future - Estradiol - Progesterone - Testosterone,Free and Total - FSH/LH  4. Vasomotor symptoms due to menopause Labs ordered - Estradiol - Progesterone - Testosterone,Free and Total - FSH/LH  5. Encounter for screening mammogram for malignant neoplasm of breast Routine mammogram ordered. Routine labs ordered - MM 3D SCREENING MAMMOGRAM BILATERAL BREAST; Future - Estradiol - Progesterone - Testosterone,Free and Total - FSH/LH    General Counseling: Alejandra Castro verbalizes understanding of the findings of todays visit and agrees with plan of treatment. I have discussed any further diagnostic evaluation that may be needed or ordered today. We also reviewed her medications today. she has been encouraged to call the office with any questions or concerns that should arise related to todays visit.    Orders Placed This Encounter  Procedures   MM 3D SCREENING MAMMOGRAM BILATERAL BREAST   DG Bone Density   Estradiol   Progesterone   Testosterone,Free and Total   FSH/LH    Meds ordered this encounter  Medications   metoprolol succinate (TOPROL-XL) 100 MG 24 hr tablet    Sig: Take 1 tablet (100 mg total) by mouth daily.    Dispense:  90 tablet    Refill:  1    For next fill   lisinopril  (ZESTRIL) 40 MG tablet    Sig:  Take 1 tablet (40 mg total) by mouth daily.    Dispense:  90 tablet    Refill:  1    For next fill   simvastatin (ZOCOR) 20 MG tablet    Sig: Take 1 tablet (20 mg total) by mouth daily at 6 PM.    Dispense:  90 tablet    Refill:  0    For next fill   amLODipine (NORVASC) 5 MG tablet    Sig: TAKE 1 TABLET(5 MG) BY MOUTH DAILY    Dispense:  90 tablet    Refill:  1    For next fill    Return in about 1 week (around 06/27/2022) for F/U, Labs, Baruch Lewers PCP.  Time spent:30 Minutes Time spent with patient included reviewing progress notes, labs, imaging studies, and discussing plan for follow up.   [place] Controlled Substance Database was reviewed by me for overdose risk score (ORS)   This patient was seen by Jonetta Osgood, FNP-C in collaboration with Dr. Clayborn Bigness as a part of collaborative care agreement.   Tauriel Scronce R. Valetta Fuller, MSN, FNP-C Internal Medicine

## 2022-06-23 ENCOUNTER — Encounter: Payer: Self-pay | Admitting: Nurse Practitioner

## 2022-06-23 DIAGNOSIS — G4733 Obstructive sleep apnea (adult) (pediatric): Secondary | ICD-10-CM | POA: Diagnosis not present

## 2022-06-28 DIAGNOSIS — N951 Menopausal and female climacteric states: Secondary | ICD-10-CM | POA: Diagnosis not present

## 2022-06-28 DIAGNOSIS — M8580 Other specified disorders of bone density and structure, unspecified site: Secondary | ICD-10-CM | POA: Diagnosis not present

## 2022-06-28 DIAGNOSIS — E2839 Other primary ovarian failure: Secondary | ICD-10-CM | POA: Diagnosis not present

## 2022-06-28 DIAGNOSIS — Z1231 Encounter for screening mammogram for malignant neoplasm of breast: Secondary | ICD-10-CM | POA: Diagnosis not present

## 2022-06-29 NOTE — Progress Notes (Signed)
Will discuss labs at upcoming visit

## 2022-06-30 LAB — PROGESTERONE: Progesterone: 0.2 ng/mL

## 2022-06-30 LAB — FSH/LH
FSH: 67 m[IU]/mL (ref 25.8–134.8)
LH: 26.6 m[IU]/mL (ref 7.7–58.5)

## 2022-06-30 LAB — TESTOSTERONE,FREE AND TOTAL
Testosterone, Free: 1.2 pg/mL (ref 0.0–4.2)
Testosterone: 12 ng/dL (ref 3–67)

## 2022-06-30 LAB — ESTRADIOL: Estradiol: <5 pg/mL (ref 0.0–54.7)

## 2022-07-01 DIAGNOSIS — G4733 Obstructive sleep apnea (adult) (pediatric): Secondary | ICD-10-CM | POA: Diagnosis not present

## 2022-07-02 ENCOUNTER — Encounter: Payer: Self-pay | Admitting: Nurse Practitioner

## 2022-07-11 ENCOUNTER — Ambulatory Visit (INDEPENDENT_AMBULATORY_CARE_PROVIDER_SITE_OTHER): Payer: Medicare Other | Admitting: Nurse Practitioner

## 2022-07-11 ENCOUNTER — Encounter: Payer: Self-pay | Admitting: Nurse Practitioner

## 2022-07-11 ENCOUNTER — Other Ambulatory Visit: Payer: Self-pay

## 2022-07-11 VITALS — BP 158/88 | HR 82 | Temp 98.4°F | Resp 16 | Ht 66.0 in | Wt 183.4 lb

## 2022-07-11 DIAGNOSIS — N951 Menopausal and female climacteric states: Secondary | ICD-10-CM | POA: Diagnosis not present

## 2022-07-11 DIAGNOSIS — M8580 Other specified disorders of bone density and structure, unspecified site: Secondary | ICD-10-CM | POA: Diagnosis not present

## 2022-07-11 DIAGNOSIS — I10 Essential (primary) hypertension: Secondary | ICD-10-CM

## 2022-07-11 MED ORDER — CONJ ESTROG-MEDROXYPROGEST ACE 0.3-1.5 MG PO TABS
1.0000 | ORAL_TABLET | Freq: Every day | ORAL | 2 refills | Status: DC
Start: 2022-07-11 — End: 2022-08-10
  Filled 2022-07-11: qty 28, 28d supply, fill #0
  Filled 2022-08-05: qty 28, 28d supply, fill #1

## 2022-07-11 NOTE — Progress Notes (Signed)
[facility] [place], [place] [address]  Internal MEDICINE  Office Visit Note  Patient Name: Alejandra Castro  [id number]  [id number]  Date of Service: 07/11/2022  Chief Complaint  Patient presents with   Hyperlipidemia   Hypertension   Follow-up    Review labs     HPI Alejandra Castro presents for a follow-up visit for HRT  Ready for HRT, wants to try oral medication.  Postmenopausal per elevated FSH  Still has uterus.  Has been sleeping better.  Has white coat syndrome, BP is better at home.    Current Medication: Outpatient Encounter Medications as of 07/11/2022  Medication Sig   albuterol (VENTOLIN HFA) 108 (90 Base) MCG/ACT inhaler Inhale 2 puffs into the lungs every 4 (four) hours as needed for wheezing or shortness of breath.   ALPRAZolam (XANAX) 0.25 MG tablet Take 1 tablet (0.25 mg total) by mouth at bedtime as needed for anxiety.   amLODipine (NORVASC) 5 MG tablet Take 1 tablet (5 mg total) by mouth daily.   aspirin EC 81 MG tablet Take 81 mg by mouth daily.   Calcium Carb-Cholecalciferol 600-10 MG-MCG TABS Take by mouth.   Cholecalciferol 50 MCG (2000 UT) TABS Take by mouth.   estrogen, conjugated,-medroxyprogesterone (PREMPRO) 0.3-1.5 MG tablet Take 1 tablet by mouth daily.   fluticasone (FLONASE) 50 MCG/ACT nasal spray Place 2 sprays into both nostrils daily.   lisinopril (ZESTRIL) 40 MG tablet Take 1 tablet (40 mg total) by mouth daily.   magnesium oxide (MAG-OX) 400 MG tablet Take by mouth.   meclizine (ANTIVERT) 25 MG tablet Take by mouth.   metoprolol succinate (TOPROL-XL) 100 MG 24 hr tablet Take 1 tablet (100 mg total) by mouth daily.   Multiple Vitamin (MULTIVITAMIN WITH MINERALS) TABS tablet Take 1 tablet by mouth daily.   Omega-3 Fatty Acids (FISH OIL PO) Take 2 capsules by mouth daily.   simvastatin (ZOCOR) 20 MG tablet Take 1 tablet (20 mg total) by mouth daily at 6 PM.   [DISCONTINUED] felodipine (PLENDIL) 5 MG 24 hr tablet Take 5 mg  by mouth at bedtime.    [DISCONTINUED] FLUoxetine (PROZAC) 20 MG capsule Take 20-40 mg by mouth every morning. USUALLY JUST TAKES 1 BUT WILL TAKE 2 IF NEEDED    No facility-administered encounter medications on file as of 07/11/2022.    Surgical History: Past Surgical History:  Procedure Laterality Date   COLONOSCOPY     COLONOSCOPY WITH PROPOFOL N/A 02/16/2022   Procedure: COLONOSCOPY WITH PROPOFOL;  Surgeon: Lin Landsman, MD;  Location[facility] ENDOSCOPY;  Service: Gastroenterology;  Laterality: N/A;    Medical History: Past Medical History:  Diagnosis Date   Arthritis    Hyperlipidemia    Hypertension     Family History: Family History  Problem Relation Age of Onset   Breast cancer Neg Hx     Social History   Socioeconomic History   Marital status: Widowed    Spouse name: Not on file   Number of children: Not on file   Years of education: Not on file   Highest education level: Not on file  Occupational History   Not on file  Tobacco Use   Smoking status: Never   Smokeless tobacco: Never  Vaping Use   Vaping Use: Never used  Substance and Sexual Activity   Alcohol use: Yes    Alcohol/week: 14.0 standard drinks of alcohol    Types: 14 Cans of beer per week    Comment: rarely  Drug use: No   Sexual activity: Not Currently  Other Topics Concern   Not on file  Social History Narrative   Not on file   Social Determinants of Health   Financial Resource Strain: Low Risk  (10/28/2021)   Overall Financial Resource Strain (CARDIA)    Difficulty of Paying Living Expenses: Not hard at all  Food Insecurity: No Food Insecurity (10/28/2021)   Hunger Vital Sign    Worried About Running Out of Food in the Last Year: Never true    Ran Out of Food in the Last Year: Never true  Transportation Needs: No Transportation Needs (10/28/2021)   PRAPARE - Hydrologist (Medical): No    Lack of Transportation (Non-Medical): No  Physical Activity:  Sufficiently Active (10/28/2021)   Exercise Vital Sign    Days of Exercise per Week: 4 days    Minutes of Exercise per Session: 40 min  Stress: No Stress Concern Present (10/28/2021)   [place]    Feeling of Stress : Not at all  Social Connections: Socially Isolated (10/28/2021)   Social Connection and Isolation Panel [NHANES]    Frequency of Communication with Friends and Family: More than three times a week    Frequency of Social Gatherings with Friends and Family: More than three times a week    Attends Religious Services: Never    Marine scientist or Organizations: No    Attends Archivist Meetings: Never    Marital Status: Divorced  Human resources officer Violence: Not At Risk (10/28/2021)   Humiliation, Afraid, Rape, and Kick questionnaire    Fear of Current or Ex-Partner: No    Emotionally Abused: No    Physically Abused: No    Sexually Abused: No      Review of Systems  Constitutional:  Negative for chills, fatigue and unexpected weight change.  HENT:  Negative for congestion, rhinorrhea, sneezing and sore throat.   Eyes:  Negative for redness.  Respiratory: Negative.  Negative for cough, chest tightness and shortness of breath.   Cardiovascular: Negative.  Negative for chest pain and palpitations.  Gastrointestinal: Negative.  Negative for abdominal pain, constipation, diarrhea, nausea and vomiting.  Genitourinary:  Negative for dysuria and frequency.  Musculoskeletal:  Negative for arthralgias, back pain, joint swelling and neck pain.  Skin:  Negative for rash.  Neurological: Negative.  Negative for tremors and numbness.  Hematological:  Negative for adenopathy. Does not bruise/bleed easily.  Psychiatric/Behavioral:  Negative for behavioral problems (Depression), sleep disturbance and suicidal ideas. The patient is not nervous/anxious.     Vital Signs: BP (!) 158/88 Comment: 150/84  Pulse 82    Temp 98.4 F (36.9 C)   Resp 16   Ht 5\' 6"  (1.676 m)   Wt 183 lb 6.4 oz (83.2 kg)   SpO2 99%   BMI 29.60 kg/m    Physical Exam Vitals reviewed.  Constitutional:      General: She is not in acute distress.    Appearance: Normal appearance. She is not ill-appearing.  HENT:     Head: Normocephalic and atraumatic.  Eyes:     Pupils: Pupils are equal, round, and reactive to light.  Cardiovascular:     Rate and Rhythm: Normal rate and regular rhythm.  Pulmonary:     Effort: Pulmonary effort is normal. No respiratory distress.  Neurological:     Mental Status: She is alert and oriented to person, place,  and time.  Psychiatric:        Mood and Affect: Mood normal.        Behavior: Behavior normal.        Assessment/Plan: 1. Vasomotor symptoms due to menopause Start prempro for vasomotor symptoms. Follow up in 1 month.  - estrogen, conjugated,-medroxyprogesterone (PREMPRO) 0.3-1.5 MG tablet; Take 1 tablet by mouth daily.  Dispense: 28 tablet; Refill: 2  2. Benign essential hypertension Has white coat syndrome, BP is ok at home but elevated at office.  3. Osteopenia, unspecified location Start prempro for HRT, this will help to prevent further changes from osteopenia to osteoporosis.  - estrogen, conjugated,-medroxyprogesterone (PREMPRO) 0.3-1.5 MG tablet; Take 1 tablet by mouth daily.  Dispense: 28 tablet; Refill: 2   General Counseling: Alejandra Castro understanding of the findings of todays visit and agrees with plan of treatment. I have discussed any further diagnostic evaluation that may be needed or ordered today. We also reviewed her medications today. she has been encouraged to call the office with any questions or concerns that should arise related to todays visit.    No orders of the defined types were placed in this encounter.   Meds ordered this encounter  Medications   estrogen, conjugated,-medroxyprogesterone (PREMPRO) 0.3-1.5 MG tablet    Sig: Take 1  tablet by mouth daily.    Dispense:  28 tablet    Refill:  2    Return in about 1 month (around 08/10/2022) for F/U, eval new med, [place] PCP.   Total time spent:30 Minutes Time spent includes review of chart, medications, test results, and follow up plan with the patient.   [place] Controlled Substance Database was reviewed by me.  This patient was seen by Jonetta Osgood, FNP-C in collaboration with Dr. Clayborn Bigness as a part of collaborative care agreement.   Galo Sayed R. Valetta Fuller, MSN, FNP-C Internal medicine

## 2022-07-17 ENCOUNTER — Other Ambulatory Visit: Payer: Self-pay

## 2022-08-10 ENCOUNTER — Ambulatory Visit (INDEPENDENT_AMBULATORY_CARE_PROVIDER_SITE_OTHER): Payer: Medicare Other | Admitting: Nurse Practitioner

## 2022-08-10 ENCOUNTER — Encounter: Payer: Self-pay | Admitting: Nurse Practitioner

## 2022-08-10 ENCOUNTER — Other Ambulatory Visit: Payer: Self-pay

## 2022-08-10 VITALS — BP 135/80 | HR 69 | Temp 98.4°F | Resp 16 | Ht 66.0 in | Wt 185.6 lb

## 2022-08-10 DIAGNOSIS — G4700 Insomnia, unspecified: Secondary | ICD-10-CM | POA: Diagnosis not present

## 2022-08-10 DIAGNOSIS — I10 Essential (primary) hypertension: Secondary | ICD-10-CM | POA: Diagnosis not present

## 2022-08-10 DIAGNOSIS — M8580 Other specified disorders of bone density and structure, unspecified site: Secondary | ICD-10-CM | POA: Diagnosis not present

## 2022-08-10 DIAGNOSIS — N951 Menopausal and female climacteric states: Secondary | ICD-10-CM

## 2022-08-10 MED ORDER — CONJ ESTROG-MEDROXYPROGEST ACE 0.3-1.5 MG PO TABS
1.0000 | ORAL_TABLET | Freq: Every day | ORAL | 1 refills | Status: DC
Start: 2022-08-10 — End: 2022-11-08
  Filled 2022-08-10 – 2022-09-01 (×2): qty 84, 84d supply, fill #0

## 2022-08-10 MED ORDER — ALPRAZOLAM 0.25 MG PO TABS
0.2500 mg | ORAL_TABLET | Freq: Every evening | ORAL | 3 refills | Status: DC | PRN
Start: 2022-08-10 — End: 2022-11-08
  Filled 2022-08-10 – 2022-08-14 (×2): qty 30, 30d supply, fill #0
  Filled 2022-09-11: qty 30, 30d supply, fill #1
  Filled 2022-10-16: qty 30, 30d supply, fill #2

## 2022-08-10 NOTE — Progress Notes (Signed)
[facility] [address] [place], [place] [address]  Internal MEDICINE  Office Visit Note  Patient Name: Alejandra Castro  [id number]  [id number]  Date of Service: 08/10/2022  Chief Complaint  Patient presents with   Hyperlipidemia   Hypertension   Follow-up    HPI [name] presents for a follow-up visit for hypertension, vasomotor symptoms, and anxiety.  Elevated blood pressure, improved when rechecked. Currently taking lisinopril, amlodipine, and metoprolol.  Doing well on prempro -- symptoms have improved and current dose is tolerated and effective. Taking alprazolam for increased anxiety and insomnia.     Current Medication: Outpatient Encounter Medications as of 08/10/2022  Medication Sig   albuterol (VENTOLIN HFA) 108 (90 Base) MCG/ACT inhaler Inhale 2 puffs into the lungs every 4 (four) hours as needed for wheezing or shortness of breath.   amLODipine (NORVASC) 5 MG tablet Take 1 tablet (5 mg total) by mouth daily.   aspirin EC 81 MG tablet Take 81 mg by mouth daily.   Calcium Carb-Cholecalciferol 600-10 MG-MCG TABS Take by mouth.   Cholecalciferol 50 MCG (2000 UT) TABS Take by mouth.   fluticasone (FLONASE) 50 MCG/ACT nasal spray Place 2 sprays into both nostrils daily.   lisinopril (ZESTRIL) 40 MG tablet Take 1 tablet (40 mg total) by mouth daily.   magnesium oxide (MAG-OX) 400 MG tablet Take by mouth.   meclizine (ANTIVERT) 25 MG tablet Take by mouth.   metoprolol succinate (TOPROL-XL) 100 MG 24 hr tablet Take 1 tablet (100 mg total) by mouth daily.   Multiple Vitamin (MULTIVITAMIN WITH MINERALS) TABS tablet Take 1 tablet by mouth daily.   Omega-3 Fatty Acids (FISH OIL PO) Take 2 capsules by mouth daily.   simvastatin (ZOCOR) 20 MG tablet Take 1 tablet (20 mg total) by mouth daily at 6 PM.   [DISCONTINUED] ALPRAZolam (XANAX) 0.25 MG tablet Take 1 tablet (0.25 mg total) by mouth at bedtime as needed for anxiety.   [DISCONTINUED] estrogen,  conjugated,-medroxyprogesterone (PREMPRO) 0.3-1.5 MG tablet Take 1 tablet by mouth daily.   ALPRAZolam (XANAX) 0.25 MG tablet Take 1 tablet (0.25 mg total) by mouth at bedtime as needed for anxiety.   estrogen, conjugated,-medroxyprogesterone (PREMPRO) 0.3-1.5 MG tablet Take 1 tablet by mouth daily.   [DISCONTINUED] felodipine (PLENDIL) 5 MG 24 hr tablet Take 5 mg by mouth at bedtime.    [DISCONTINUED] FLUoxetine (PROZAC) 20 MG capsule Take 20-40 mg by mouth every morning. USUALLY JUST TAKES 1 BUT WILL TAKE 2 IF NEEDED    No facility-administered encounter medications on file as of 08/10/2022.    Surgical History: Past Surgical History:  Procedure Laterality Date   COLONOSCOPY     COLONOSCOPY WITH PROPOFOL N/A 02/16/2022   Procedure: COLONOSCOPY WITH PROPOFOL;  Surgeon: Toney Reil, MD;  Location: [facility];  Service: Gastroenterology;  Laterality: N/A;    Medical History: Past Medical History:  Diagnosis Date   Arthritis    Hyperlipidemia    Hypertension     Family History: Family History  Problem Relation Age of Onset   Breast cancer Neg Hx     Social History   Socioeconomic History   Marital status: Widowed    Spouse name: Not on file   Number of children: Not on file   Years of education: Not on file   Highest education level: Not on file  Occupational History   Not on file  Tobacco Use   Smoking status: Never   Smokeless tobacco: Never  Vaping Use  Vaping Use: Never used  Substance and Sexual Activity   Alcohol use: Yes    Alcohol/week: 14.0 standard drinks of alcohol    Types: 14 Cans of beer per week    Comment: rarely   Drug use: No   Sexual activity: Not Currently  Other Topics Concern   Not on file  Social History Narrative   Not on file   Social Determinants of Health   Financial Resource Strain: Low Risk  (10/28/2021)   Overall Financial Resource Strain (CARDIA)    Difficulty of Paying Living Expenses: Not hard at all  Food  Insecurity: No Food Insecurity (10/28/2021)   Hunger Vital Sign    Worried About Running Out of Food in the Last Year: Never true    Ran Out of Food in the Last Year: Never true  Transportation Needs: No Transportation Needs (10/28/2021)   PRAPARE - Administrator, Civil Service (Medical): No    Lack of Transportation (Non-Medical): No  Physical Activity: Sufficiently Active (10/28/2021)   Exercise Vital Sign    Days of Exercise per Week: 4 days    Minutes of Exercise per Session: 40 min  Stress: No Stress Concern Present (10/28/2021)   [employer] of Occupational Health - Occupational Stress Questionnaire    Feeling of Stress : Not at all  Social Connections: Socially Isolated (10/28/2021)   Social Connection and Isolation Panel [NHANES]    Frequency of Communication with Friends and Family: More than three times a week    Frequency of Social Gatherings with Friends and Family: More than three times a week    Attends Religious Services: Never    Database administrator or Organizations: No    Attends Banker Meetings: Never    Marital Status: Divorced  Catering manager Violence: Not At Risk (10/28/2021)   Humiliation, Afraid, Rape, and Kick questionnaire    Fear of Current or Ex-Partner: No    Emotionally Abused: No    Physically Abused: No    Sexually Abused: No      Review of Systems  Constitutional:  Negative for chills, fatigue and unexpected weight change.  HENT:  Negative for congestion, rhinorrhea, sneezing and sore throat.   Respiratory: Negative.  Negative for cough, chest tightness, shortness of breath and wheezing.   Cardiovascular: Negative.  Negative for chest pain and palpitations.  Gastrointestinal: Negative.  Negative for abdominal pain, constipation, diarrhea, nausea and vomiting.  Genitourinary:  Negative for dysuria and frequency.  Musculoskeletal:  Negative for arthralgias, back pain, joint swelling and neck pain.  Skin:  Negative  for rash.  Neurological: Negative.   Psychiatric/Behavioral:  Positive for sleep disturbance. Negative for behavioral problems (Depression), self-injury and suicidal ideas. The patient is nervous/anxious.     Vital Signs: BP 135/80 Comment: 157/81  Pulse 69   Temp 98.4 F (36.9 C)   Resp 16   Ht 5\' 6"  (1.676 m)   Wt 185 lb 9.6 oz (84.2 kg)   SpO2 97%   BMI 29.96 kg/m    Physical Exam Vitals reviewed.  Constitutional:      General: She is not in acute distress.    Appearance: Normal appearance. She is obese. She is not ill-appearing.  HENT:     Head: Normocephalic and atraumatic.  Eyes:     Pupils: Pupils are equal, round, and reactive to light.  Cardiovascular:     Rate and Rhythm: Normal rate and regular rhythm.  Pulmonary:  Effort: Pulmonary effort is normal. No respiratory distress.  Neurological:     Mental Status: She is alert and oriented to person, place, and time.  Psychiatric:        Mood and Affect: Mood normal.        Behavior: Behavior normal.        Assessment/Plan: 1. Vasomotor symptoms due to menopause Continue prempro as prescribed.  - estrogen, conjugated,-medroxyprogesterone (PREMPRO) 0.3-1.5 MG tablet; Take 1 tablet by mouth daily.  Dispense: 84 tablet; Refill: 1  2. Osteopenia, unspecified location Continue prempro as prescribed.  - estrogen, conjugated,-medroxyprogesterone (PREMPRO) 0.3-1.5 MG tablet; Take 1 tablet by mouth daily.  Dispense: 84 tablet; Refill: 1  3. Benign essential hypertension Stable, Continue medications as prescribed. No changes.   4. Insomnia, unspecified type Continue alprazolam as prescribed. - ALPRAZolam (XANAX) 0.25 MG tablet; Take 1 tablet (0.25 mg total) by mouth at bedtime as needed for anxiety.  Dispense: 30 tablet; Refill: 3    General Counseling: Felix verbalizes understanding of the findings of todays visit and agrees with plan of treatment. I have discussed any further diagnostic evaluation that may  be needed or ordered today. We also reviewed her medications today. she has been encouraged to call the office with any questions or concerns that should arise related to todays visit.    No orders of the defined types were placed in this encounter.   Meds ordered this encounter  Medications   ALPRAZolam (XANAX) 0.25 MG tablet    Sig: Take 1 tablet (0.25 mg total) by mouth at bedtime as needed for anxiety.    Dispense:  30 tablet    Refill:  3   estrogen, conjugated,-medroxyprogesterone (PREMPRO) 0.3-1.5 MG tablet    Sig: Take 1 tablet by mouth daily.    Dispense:  84 tablet    Refill:  1    Please fill for 90 days    Return in about 3 months (around 11/06/2022) for F/U, anxiety med refill, Rashaan Wyles PCP.   Total time spent:30 Minutes Time spent includes review of chart, medications, test results, and follow up plan with the patient.   [place] Controlled Substance Database was reviewed by me.  This patient was seen by Sallyanne Kuster, FNP-C in collaboration with Dr. Beverely Risen as a part of collaborative care agreement.   Amro Winebarger R. Tedd Sias, MSN, FNP-C Internal medicine

## 2022-08-14 ENCOUNTER — Other Ambulatory Visit: Payer: Self-pay

## 2022-08-14 ENCOUNTER — Ambulatory Visit (INDEPENDENT_AMBULATORY_CARE_PROVIDER_SITE_OTHER): Payer: Medicare Other | Admitting: Internal Medicine

## 2022-08-14 VITALS — BP 132/78 | HR 56 | Resp 14 | Ht 66.0 in | Wt 184.0 lb

## 2022-08-14 DIAGNOSIS — G4733 Obstructive sleep apnea (adult) (pediatric): Secondary | ICD-10-CM

## 2022-08-14 DIAGNOSIS — Z7189 Other specified counseling: Secondary | ICD-10-CM

## 2022-08-14 DIAGNOSIS — I10 Essential (primary) hypertension: Secondary | ICD-10-CM | POA: Diagnosis not present

## 2022-08-14 NOTE — Progress Notes (Signed)
[facility] [address] [place], [place] [address]  Pulmonary Sleep Medicine   Office Visit Note  Patient Name: Alejandra Castro DOB: [birth year] MRN [id number]    Chief Complaint: Obstructive Sleep Apnea visit  Brief History:  [name] is seen today for follow up 3 months after setup on CPAP at 8 cmh20.  The patient has a 5 year history of sleep apnea. Patient is using PAP nightly.  The patient feels rested after sleeping with PAP.  The patient reports benefit from PAP use. Reported sleepiness is improved and the Epworth Sleepiness Score is 2 out of 24. The patient does not take naps. The patient complains of the following: No complaints.  The compliance download shows 100% compliance with an average use time of 10:44 hours. The AHI is 1.2.  The patient does not complain of limb movements disrupting sleep.  ROS  General: (-) fever, (-) chills, (-) night sweat Nose and Sinuses: (-) nasal stuffiness or itchiness, (-) postnasal drip, (-) nosebleeds, (-) sinus trouble. Mouth and Throat: (-) sore throat, (-) hoarseness. Neck: (-) swollen glands, (-) enlarged thyroid, (-) neck pain. Respiratory: - cough, - shortness of breath, - wheezing. Neurologic: - numbness, - tingling. Psychiatric: - anxiety, - depression   Current Medication: Outpatient Encounter Medications as of 08/14/2022  Medication Sig   albuterol (VENTOLIN HFA) 108 (90 Base) MCG/ACT inhaler Inhale 2 puffs into the lungs every 4 (four) hours as needed for wheezing or shortness of breath.   ALPRAZolam (XANAX) 0.25 MG tablet Take 1 tablet (0.25 mg total) by mouth at bedtime as needed for anxiety.   amLODipine (NORVASC) 5 MG tablet Take 1 tablet (5 mg total) by mouth daily.   aspirin EC 81 MG tablet Take 81 mg by mouth daily.   Calcium Carb-Cholecalciferol 600-10 MG-MCG TABS Take by mouth.   Cholecalciferol 50 MCG (2000 UT) TABS Take by mouth.   estrogen, conjugated,-medroxyprogesterone (PREMPRO) 0.3-1.5 MG  tablet Take 1 tablet by mouth daily.   fluticasone (FLONASE) 50 MCG/ACT nasal spray Place 2 sprays into both nostrils daily.   lisinopril (ZESTRIL) 40 MG tablet Take 1 tablet (40 mg total) by mouth daily.   magnesium oxide (MAG-OX) 400 MG tablet Take by mouth.   meclizine (ANTIVERT) 25 MG tablet Take by mouth.   metoprolol succinate (TOPROL-XL) 100 MG 24 hr tablet Take 1 tablet (100 mg total) by mouth daily.   Multiple Vitamin (MULTIVITAMIN WITH MINERALS) TABS tablet Take 1 tablet by mouth daily.   Omega-3 Fatty Acids (FISH OIL PO) Take 2 capsules by mouth daily.   simvastatin (ZOCOR) 20 MG tablet Take 1 tablet (20 mg total) by mouth daily at 6 PM.   [DISCONTINUED] felodipine (PLENDIL) 5 MG 24 hr tablet Take 5 mg by mouth at bedtime.    [DISCONTINUED] FLUoxetine (PROZAC) 20 MG capsule Take 20-40 mg by mouth every morning. USUALLY JUST TAKES 1 BUT WILL TAKE 2 IF NEEDED    No facility-administered encounter medications on file as of 08/14/2022.    Surgical History: Past Surgical History:  Procedure Laterality Date   COLONOSCOPY     COLONOSCOPY WITH PROPOFOL N/A 02/16/2022   Procedure: COLONOSCOPY WITH PROPOFOL;  Surgeon: Toney Reil, MD;  Location: [facility]COPY;  Service: Gastroenterology;  Laterality: N/A;    Medical History: Past Medical History:  Diagnosis Date   Arthritis    Hyperlipidemia    Hypertension     Family History: Non contributory to the present illness  Social History: Social History  Socioeconomic History   Marital status: Widowed    Spouse name: Not on file   Number of children: Not on file   Years of education: Not on file   Highest education level: Not on file  Occupational History   Not on file  Tobacco Use   Smoking status: Never   Smokeless tobacco: Never  Vaping Use   Vaping Use: Never used  Substance and Sexual Activity   Alcohol use: Yes    Alcohol/week: 14.0 standard drinks of alcohol    Types: 14 Cans of beer per week    Comment:  rarely   Drug use: No   Sexual activity: Not Currently  Other Topics Concern   Not on file  Social History Narrative   Not on file   Social Determinants of Health   Financial Resource Strain: Low Risk  (10/28/2021)   Overall Financial Resource Strain (CARDIA)    Difficulty of Paying Living Expenses: Not hard at all  Food Insecurity: No Food Insecurity (10/28/2021)   Hunger Vital Sign    Worried About Running Out of Food in the Last Year: Never true    Ran Out of Food in the Last Year: Never true  Transportation Needs: No Transportation Needs (10/28/2021)   PRAPARE - Administrator, Civil Service (Medical): No    Lack of Transportation (Non-Medical): No  Physical Activity: Sufficiently Active (10/28/2021)   Exercise Vital Sign    Days of Exercise per Week: 4 days    Minutes of Exercise per Session: 40 min  Stress: No Stress Concern Present (10/28/2021)   [employer] of Occupational Health - Occupational Stress Questionnaire    Feeling of Stress : Not at all  Social Connections: Socially Isolated (10/28/2021)   Social Connection and Isolation Panel [NHANES]    Frequency of Communication with Friends and Family: More than three times a week    Frequency of Social Gatherings with Friends and Family: More than three times a week    Attends Religious Services: Never    Database administrator or Organizations: No    Attends Banker Meetings: Never    Marital Status: Divorced  Catering manager Violence: Not At Risk (10/28/2021)   Humiliation, Afraid, Rape, and Kick questionnaire    Fear of Current or Ex-Partner: No    Emotionally Abused: No    Physically Abused: No    Sexually Abused: No    Vital Signs: There were no vitals taken for this visit. There is no height or weight on file to calculate BMI.    Examination: General Appearance: The patient is well-developed, well-nourished, and in no distress. Neck Circumference: 39 cm Skin: Gross inspection  of skin unremarkable. Head: normocephalic, no gross deformities. Eyes: no gross deformities noted. ENT: ears appear grossly normal Neurologic: Alert and oriented. No involuntary movements.  STOP BANG RISK ASSESSMENT S (snore) Have you been told that you snore?     NO   T (tired) Are you often tired, fatigued, or sleepy during the day?   NO  O (obstruction) Do you stop breathing, choke, or gasp during sleep? NO   P (pressure) Do you have or are you being treated for high blood pressure? YES   B (BMI) Is your body index greater than 35 kg/m? NO   A (age) Are you 88 years old or older? YES   N (neck) Do you have a neck circumference greater than 16 inches?   NO   G (gender)  Are you a female? NO   TOTAL STOP/BANG "YES" ANSWERS 2       A STOP-Bang score of 2 or less is considered low risk, and a score of 5 or more is high risk for having either moderate or severe OSA. For people who score 3 or 4, doctors may need to perform further assessment to determine how likely they are to have OSA.         EPWORTH SLEEPINESS SCALE:  Scale:  (0)= no chance of dozing; (1)= slight chance of dozing; (2)= moderate chance of dozing; (3)= high chance of dozing  Chance  Situtation    Sitting and reading: 0    Watching TV: 0    Sitting Inactive in public: 0    As a passenger in car: 2      Lying down to rest: 0    Sitting and talking: 0    Sitting quielty after lunch: 0    In a car, stopped in traffic: 0   TOTAL SCORE:   2 out of 24    SLEEP STUDIES:  SPLIT (02/23/17) AHI 108, min SPO2 85%   CPAP COMPLIANCE DATA:  Date Range: 05/11/22 - 06/09/22  Average Daily Use: 10:44 hours  Median Use: 10:51  Compliance for > 4 Hours: 30 days  AHI: 1.2 respiratory events per hour  Days Used: 30/30  Mask Leak: 14.9  95th Percentile Pressure: 8 cmh20         LABS: Recent Results (from the past 2160 hour(s))  Estradiol     Status: None   Collection Time: 06/28/22  2:18  PM  Result Value Ref Range   Estradiol <5.0 0.0 - 54.7 pg/mL    Comment:                      Adult Female             Range                       Follicular phase     12.5 - 166.0                       Ovulation phase      85.8 - 498.0                       Luteal phase         43.8 - 211.0                       Postmenopausal       <6.0 -  54.7                      Pregnancy                       1st trimester     215.0 - >4300.0 Roche ECLIA methodology   Progesterone     Status: None   Collection Time: 06/28/22  2:18 PM  Result Value Ref Range   Progesterone 0.2 ng/mL    Comment:                      Follicular phase       0.1 -   0.9  Luteal phase           1.8 -  23.9                      Ovulation phase        0.1 -  12.0                      Pregnant                         First trimester    11.0 -  44.3                         Second trimester   25.4 -  83.3                         Third trimester    58.7 - 214.0                      Postmenopausal         0.0 -   0.1   Testosterone,Free and Total     Status: None   Collection Time: 06/28/22  2:18 PM  Result Value Ref Range   Testosterone 12 3 - 67 ng/dL   Testosterone, Free 1.2 0.0 - 4.2 pg/mL  FSH/LH     Status: None   Collection Time: 06/28/22  2:18 PM  Result Value Ref Range   LH 26.6 7.7 - 58.5 mIU/mL    Comment:                      Adult Female              Range                       Follicular phase      2.4 -  12.6                       Ovulation phase      14.0 -  95.6                       Luteal phase          1.0 -  11.4                       Postmenopausal        7.7 -  58.5    FSH 67.0 25.8 - 134.8 mIU/mL    Comment:                      Adult Female             Range                       Follicular phase      3.5 -  12.5                       Ovulation phase       4.7 -  21.5                       Luteal phase  1.7 -   7.7                       Postmenopausal        25.8 - 134.8     Radiology: No results found.  No results found.  No results found.    Assessment and Plan: Patient Active Problem List   Diagnosis Date Noted   Sigmoid diverticulitis 02/16/2022   Erythema of colon 02/16/2022   Abnormal cortisol level 11/30/2021   Abnormal CBC 08/24/2021   Abnormal TSH 08/24/2021   Depression 03/14/2021   Mixed hyperlipidemia 03/14/2021   Insomnia 03/14/2021   Vaginal dryness 01/04/2021   CPAP use counseling 03/15/2020   Overweight (BMI 25.0-29.9) 03/15/2020   Osteopenia 07/15/2019   Breast calcification, left 08/07/2017   Anxiety 06/27/2017   OSA on CPAP 02/23/2017   History of adenomatous polyp of colon 12/24/2014   Vertigo 12/17/2014   Benign essential hypertension 12/10/2013   Asthma without status asthmaticus 12/10/2013   Essential hypertension 12/10/2013   Menopause syndrome 12/10/2013   1. OSA on CPAP The patient does tolerate PAP and reports  benefit from PAP use. The patient was reminded how to clean equipment and advised to replace supplies routinely. The patient was also counselled on weight loss. The compliance is excellent. The AHI is 1.2.   OSA on cpap- controlled. Continue with excellent compliance with pap. CPAP continues to be medically necessary to treat this patient's OSA. F/u one year.    2. CPAP use counseling CPAP Counseling: had a lengthy discussion with the patient regarding the importance of PAP therapy in management of the sleep apnea. Patient appears to understand the risk factor reduction and also understands the risks associated with untreated sleep apnea. Patient will try to make a good faith effort to remain compliant with therapy. Also instructed the patient on proper cleaning of the device including the water must be changed daily if possible and use of distilled water is preferred. Patient understands that the machine should be regularly cleaned with appropriate recommended cleaning solutions that do not  damage the PAP machine for example given white vinegar and water rinses. Other methods such as ozone treatment may not be as good as these simple methods to achieve cleaning.   3. Benign essential hypertension Hypertension Counseling:   The following hypertensive lifestyle modification were recommended and discussed:  1. Limiting alcohol intake to less than 1 oz/day of ethanol:(24 oz of beer or 8 oz of wine or 2 oz of 100-proof whiskey). 2. Take baby ASA 81 mg daily. 3. Importance of regular aerobic exercise and losing weight. 4. Reduce dietary saturated fat and cholesterol intake for overall cardiovascular health. 5. Maintaining adequate dietary potassium, calcium, and magnesium intake. 6. Regular monitoring of the blood pressure. 7. Reduce sodium intake to less than 100 mmol/day (less than 2.3 gm of sodium or less than 6 gm of sodium choride)      General Counseling: I have discussed the findings of the evaluation and examination with Consuella Lose.  I have also discussed any further diagnostic evaluation thatmay be needed or ordered today. Lelania verbalizes understanding of the findings of todays visit. We also reviewed her medications today and discussed drug interactions and side effects including but not limited excessive drowsiness and altered mental states. We also discussed that there is always a risk not just to her but also people around her. she has been encouraged to call the office with any questions or concerns that  should arise related to todays visit.  No orders of the defined types were placed in this encounter.       I have personally obtained a history, examined the patient, evaluated laboratory and imaging results, formulated the assessment and plan and placed orders. This patient was seen today by Tressie Ellis, PA-C in collaboration with Dr. Devona Konig.   Allyne Gee, MD [facility] Diplomate ABMS Pulmonary Critical Care Medicine and Sleep Medicine

## 2022-08-14 NOTE — Patient Instructions (Signed)

## 2022-08-16 ENCOUNTER — Ambulatory Visit
Admission: RE | Admit: 2022-08-16 | Discharge: 2022-08-16 | Disposition: A | Discharge status: Home / Self Care | Payer: Medicare Other | Source: Ambulatory Visit | Attending: Nurse Practitioner | Admitting: Nurse Practitioner

## 2022-08-16 DIAGNOSIS — M810 Age-related osteoporosis without current pathological fracture: Secondary | ICD-10-CM | POA: Diagnosis not present

## 2022-08-16 DIAGNOSIS — Z1231 Encounter for screening mammogram for malignant neoplasm of breast: Secondary | ICD-10-CM | POA: Insufficient documentation

## 2022-08-16 DIAGNOSIS — Z780 Asymptomatic menopausal state: Secondary | ICD-10-CM | POA: Insufficient documentation

## 2022-08-16 DIAGNOSIS — M8580 Other specified disorders of bone density and structure, unspecified site: Secondary | ICD-10-CM

## 2022-08-16 DIAGNOSIS — Z13820 Encounter for screening for osteoporosis: Secondary | ICD-10-CM | POA: Diagnosis not present

## 2022-08-20 ENCOUNTER — Encounter: Payer: Self-pay | Admitting: Nurse Practitioner

## 2022-08-28 DIAGNOSIS — L573 Poikiloderma of Civatte: Secondary | ICD-10-CM | POA: Diagnosis not present

## 2022-08-28 DIAGNOSIS — L578 Other skin changes due to chronic exposure to nonionizing radiation: Secondary | ICD-10-CM | POA: Diagnosis not present

## 2022-08-28 DIAGNOSIS — Z86018 Personal history of other benign neoplasm: Secondary | ICD-10-CM | POA: Diagnosis not present

## 2022-09-01 ENCOUNTER — Other Ambulatory Visit: Payer: Self-pay

## 2022-09-05 ENCOUNTER — Encounter: Payer: Self-pay | Admitting: Nurse Practitioner

## 2022-09-08 NOTE — Telephone Encounter (Signed)
Patient had annual total body skin exam with her dermatologist, Dr. Ebony Cargo on Aug 28 2022.

## 2022-09-11 ENCOUNTER — Other Ambulatory Visit: Payer: Self-pay

## 2022-09-12 ENCOUNTER — Other Ambulatory Visit: Payer: Self-pay

## 2022-09-21 ENCOUNTER — Other Ambulatory Visit: Payer: Self-pay

## 2022-09-21 ENCOUNTER — Other Ambulatory Visit: Payer: Self-pay | Admitting: Nurse Practitioner

## 2022-09-21 DIAGNOSIS — E782 Mixed hyperlipidemia: Secondary | ICD-10-CM

## 2022-09-22 ENCOUNTER — Other Ambulatory Visit: Payer: Self-pay

## 2022-09-22 MED FILL — Simvastatin Tab 20 MG: ORAL | 90 days supply | Qty: 90 | Fill #0 | Status: AC

## 2022-09-23 DIAGNOSIS — G4733 Obstructive sleep apnea (adult) (pediatric): Secondary | ICD-10-CM | POA: Diagnosis not present

## 2022-09-29 DIAGNOSIS — G4733 Obstructive sleep apnea (adult) (pediatric): Secondary | ICD-10-CM | POA: Diagnosis not present

## 2022-10-16 ENCOUNTER — Other Ambulatory Visit: Payer: Self-pay

## 2022-11-08 ENCOUNTER — Encounter: Payer: Self-pay | Admitting: Nurse Practitioner

## 2022-11-08 ENCOUNTER — Other Ambulatory Visit: Payer: Self-pay

## 2022-11-08 ENCOUNTER — Ambulatory Visit (INDEPENDENT_AMBULATORY_CARE_PROVIDER_SITE_OTHER): Payer: Medicare Other | Admitting: Nurse Practitioner

## 2022-11-08 VITALS — BP 132/78 | HR 67 | Temp 98.0°F | Resp 16 | Ht 66.0 in | Wt 189.6 lb

## 2022-11-08 DIAGNOSIS — N951 Menopausal and female climacteric states: Secondary | ICD-10-CM

## 2022-11-08 DIAGNOSIS — G4700 Insomnia, unspecified: Secondary | ICD-10-CM

## 2022-11-08 DIAGNOSIS — I10 Essential (primary) hypertension: Secondary | ICD-10-CM

## 2022-11-08 DIAGNOSIS — E782 Mixed hyperlipidemia: Secondary | ICD-10-CM

## 2022-11-08 DIAGNOSIS — M8580 Other specified disorders of bone density and structure, unspecified site: Secondary | ICD-10-CM | POA: Diagnosis not present

## 2022-11-08 MED ORDER — AMLODIPINE BESYLATE 5 MG PO TABS
5.0000 mg | ORAL_TABLET | Freq: Every day | ORAL | 1 refills | Status: DC
Start: 2022-11-08 — End: 2023-02-05
  Filled 2022-11-08 – 2022-11-22 (×2): qty 90, 90d supply, fill #0

## 2022-11-08 MED ORDER — LISINOPRIL 40 MG PO TABS
40.0000 mg | ORAL_TABLET | Freq: Every day | ORAL | 1 refills | Status: DC
Start: 2022-11-08 — End: 2023-02-05
  Filled 2022-11-08 – 2022-12-11 (×2): qty 90, 90d supply, fill #0

## 2022-11-08 MED ORDER — CONJ ESTROG-MEDROXYPROGEST ACE 0.3-1.5 MG PO TABS
1.0000 | ORAL_TABLET | Freq: Every day | ORAL | 1 refills | Status: DC
  Filled 2022-11-08 – 2022-11-22 (×2): qty 84, 84d supply, fill #0

## 2022-11-08 MED ORDER — SIMVASTATIN 20 MG PO TABS
20.0000 mg | ORAL_TABLET | Freq: Every day | ORAL | 1 refills | Status: DC
Start: 2022-11-08 — End: 2023-02-05
  Filled 2022-11-08 – 2022-12-18 (×2): qty 90, 90d supply, fill #0

## 2022-11-08 MED ORDER — ALPRAZOLAM 0.25 MG PO TABS
0.2500 mg | ORAL_TABLET | Freq: Every evening | ORAL | 2 refills | Status: DC | PRN
Start: 2022-11-08 — End: 2023-02-05
  Filled 2022-11-08 – 2022-11-13 (×3): qty 30, 30d supply, fill #0
  Filled 2022-12-11: qty 30, 30d supply, fill #1
  Filled 2023-01-08: qty 30, 30d supply, fill #2

## 2022-11-08 NOTE — Progress Notes (Signed)
[facility] [address] [place], [place] [address]  Internal MEDICINE  Office Visit Note  Patient Name: Alejandra Castro  [id number]  [id number]  Date of Service: 11/08/2022  Chief Complaint  Patient presents with   Hypertension   Hyperlipidemia   Follow-up    HPI Alejandra Castro presents for a follow-up visit for anxiety, hypertension, vasomotor, osteopenia, labs.  Anxiety -- esp at night which makes it difficult so sleep, so the alprazolam helps with anxiety and sleep at night. Due for refills.  Hypertension -- controlled with current medications which are amlodipine and lisinopril.  Vasomotor symptoms -- patient reports that prempro is still working well for Alejandra Castro.  Due for annual wellness visit and routine labs.    Current Medication: Outpatient Encounter Medications as of 11/08/2022  Medication Sig   albuterol (VENTOLIN HFA) 108 (90 Base) MCG/ACT inhaler Inhale 2 puffs into the lungs every 4 (four) hours as needed for wheezing or shortness of breath.   aspirin EC 81 MG tablet Take 81 mg by mouth daily.   Calcium Carb-Cholecalciferol 600-10 MG-MCG TABS Take by mouth.   Cholecalciferol 50 MCG (2000 UT) TABS Take by mouth.   fluticasone (FLONASE) 50 MCG/ACT nasal spray Place 2 sprays into both nostrils daily.   magnesium oxide (MAG-OX) 400 MG tablet Take by mouth.   meclizine (ANTIVERT) 25 MG tablet Take by mouth.   metoprolol succinate (TOPROL-XL) 100 MG 24 hr tablet Take 1 tablet (100 mg total) by mouth daily.   Multiple Vitamin (MULTIVITAMIN WITH MINERALS) TABS tablet Take 1 tablet by mouth daily.   Omega-3 Fatty Acids (FISH OIL PO) Take 2 capsules by mouth daily.   [DISCONTINUED] ALPRAZolam (XANAX) 0.25 MG tablet Take 1 tablet (0.25 mg total) by mouth at bedtime as needed for anxiety.   [DISCONTINUED] amLODipine (NORVASC) 5 MG tablet Take 1 tablet (5 mg total) by mouth daily.   [DISCONTINUED] estrogen, conjugated,-medroxyprogesterone (PREMPRO) 0.3-1.5 MG tablet Take  1 tablet by mouth daily.   [DISCONTINUED] lisinopril (ZESTRIL) 40 MG tablet Take 1 tablet (40 mg total) by mouth daily.   [DISCONTINUED] simvastatin (ZOCOR) 20 MG tablet Take 1 tablet (20 mg total) by mouth daily at 6 PM.   ALPRAZolam (XANAX) 0.25 MG tablet Take 1 tablet (0.25 mg total) by mouth at bedtime as needed for anxiety.   amLODipine (NORVASC) 5 MG tablet Take 1 tablet (5 mg total) by mouth daily.   estrogen, conjugated,-medroxyprogesterone (PREMPRO) 0.3-1.5 MG tablet Take 1 tablet by mouth daily.   lisinopril (ZESTRIL) 40 MG tablet Take 1 tablet (40 mg total) by mouth daily.   simvastatin (ZOCOR) 20 MG tablet Take 1 tablet (20 mg total) by mouth daily at 6 PM.   [DISCONTINUED] felodipine (PLENDIL) 5 MG 24 hr tablet Take 5 mg by mouth at bedtime.    [DISCONTINUED] FLUoxetine (PROZAC) 20 MG capsule Take 20-40 mg by mouth every morning. USUALLY JUST TAKES 1 BUT WILL TAKE 2 IF NEEDED    No facility-administered encounter medications on file as of 11/08/2022.    Surgical History: Past Surgical History:  Procedure Laterality Date   COLONOSCOPY     COLONOSCOPY WITH PROPOFOL N/A 02/16/2022   Procedure: COLONOSCOPY WITH PROPOFOL;  Surgeon: Toney Reil, MD;  Location: [facility] ENDOSCOPY;  Service: Gastroenterology;  Laterality: N/A;    Medical History: Past Medical History:  Diagnosis Date   Arthritis    Hyperlipidemia    Hypertension     Family History: Family History  Problem Relation Age of Onset  Breast cancer Neg Hx     Social History   Socioeconomic History   Marital status: Widowed    Spouse name: Not on file   Number of children: Not on file   Years of education: Not on file   Highest education level: Not on file  Occupational History   Not on file  Tobacco Use   Smoking status: Never   Smokeless tobacco: Never  Vaping Use   Vaping status: Never Used  Substance and Sexual Activity   Alcohol use: Yes    Alcohol/week: 14.0 standard drinks of alcohol     Types: 14 Cans of beer per week    Comment: rarely   Drug use: No   Sexual activity: Not Currently  Other Topics Concern   Not on file  Social History Narrative   Not on file   Social Determinants of Health   Financial Resource Strain: Low Risk  (10/28/2021)   Overall Financial Resource Strain (CARDIA)    Difficulty of Paying Living Expenses: Not hard at all  Food Insecurity: No Food Insecurity (10/28/2021)   Hunger Vital Sign    Worried About Running Out of Food in the Last Year: Never true    Ran Out of Food in the Last Year: Never true  Transportation Needs: No Transportation Needs (10/28/2021)   PRAPARE - Administrator, Civil Service (Medical): No    Lack of Transportation (Non-Medical): No  Physical Activity: Sufficiently Active (10/28/2021)   Exercise Vital Sign    Days of Exercise per Week: 4 days    Minutes of Exercise per Session: 40 min  Stress: No Stress Concern Present (10/28/2021)   [employer] of Occupational Health - Occupational Stress Questionnaire    Feeling of Stress : Not at all  Social Connections: Socially Isolated (10/28/2021)   Social Connection and Isolation Panel [NHANES]    Frequency of Communication with Friends and Family: More than three times a week    Frequency of Social Gatherings with Friends and Family: More than three times a week    Attends Religious Services: Never    Database administrator or Organizations: No    Attends Banker Meetings: Never    Marital Status: Divorced  Catering manager Violence: Not At Risk (10/28/2021)   Humiliation, Afraid, Rape, and Kick questionnaire    Fear of Current or Ex-Partner: No    Emotionally Abused: No    Physically Abused: No    Sexually Abused: No      Review of Systems  Constitutional:  Negative for chills, fatigue and unexpected weight change.  HENT:  Negative for congestion, rhinorrhea, sneezing and sore throat.   Respiratory: Negative.  Negative for cough, chest  tightness, shortness of breath and wheezing.   Cardiovascular: Negative.  Negative for chest pain and palpitations.  Gastrointestinal: Negative.  Negative for abdominal pain, constipation, diarrhea, nausea and vomiting.  Genitourinary:  Negative for dysuria and frequency.  Musculoskeletal:  Negative for arthralgias, back pain, joint swelling and neck pain.  Skin:  Negative for rash.  Neurological: Negative.   Psychiatric/Behavioral:  Positive for sleep disturbance. Negative for behavioral problems (Depression), self-injury and suicidal ideas. The patient is nervous/anxious.     Vital Signs: BP 132/78   Pulse 67   Temp 98 F (36.7 C)   Resp 16   Ht 5\' 6"  (1.676 m)   Wt 189 lb 9.6 oz (86 kg)   SpO2 97%   BMI 30.60 kg/m  Physical Exam Vitals reviewed.  Constitutional:      General: She is not in acute distress.    Appearance: Normal appearance. She is obese. She is not ill-appearing.  HENT:     Head: Normocephalic and atraumatic.  Eyes:     Pupils: Pupils are equal, round, and reactive to light.  Cardiovascular:     Rate and Rhythm: Normal rate and regular rhythm.  Pulmonary:     Effort: Pulmonary effort is normal. No respiratory distress.  Neurological:     Mental Status: She is alert and oriented to person, place, and time.  Psychiatric:        Mood and Affect: Mood normal.        Behavior: Behavior normal.        Assessment/Plan: 1. Benign essential hypertension Stable, continue amlodipine and lisinopril as prescribed. Routine labs ordered  - amLODipine (NORVASC) 5 MG tablet; Take 1 tablet (5 mg total) by mouth daily.  Dispense: 90 tablet; Refill: 1 - lisinopril (ZESTRIL) 40 MG tablet; Take 1 tablet (40 mg total) by mouth daily.  Dispense: 90 tablet; Refill: 1 - CBC with Differential/Platelet - CMP14+EGFR - Lipid Profile  2. Vasomotor symptoms due to menopause Continue prempro as prescribed. Routine labs ordered  - estrogen, conjugated,-medroxyprogesterone  (PREMPRO) 0.3-1.5 MG tablet; Take 1 tablet by mouth daily.  Dispense: 84 tablet; Refill: 1 - CBC with Differential/Platelet - CMP14+EGFR - Lipid Profile  3. Mixed hyperlipidemia Continue simvastatin as prescribed. Routine labs ordered  - simvastatin (ZOCOR) 20 MG tablet; Take 1 tablet (20 mg total) by mouth daily at 6 PM.  Dispense: 90 tablet; Refill: 1 - CBC with Differential/Platelet - CMP14+EGFR - Lipid Profile  4. Osteopenia, unspecified location Continue premrpo as prescribed. Routine labs ordered.  - estrogen, conjugated,-medroxyprogesterone (PREMPRO) 0.3-1.5 MG tablet; Take 1 tablet by mouth daily.  Dispense: 84 tablet; Refill: 1 - CBC with Differential/Platelet - CMP14+EGFR - Lipid Profile  5. Insomnia, unspecified type Continue alprazolam as prescribed. Follow up in 3 months for additional refills.  - ALPRAZolam (XANAX) 0.25 MG tablet; Take 1 tablet (0.25 mg total) by mouth at bedtime as needed for anxiety.  Dispense: 30 tablet; Refill: 2   General Counseling: Alejandra Castro understanding of the findings of todays visit and agrees with plan of treatment. I have discussed any further diagnostic evaluation that may be needed or ordered today. We also reviewed Alejandra Castro medications today. she has been encouraged to call the office with any questions or concerns that should arise related to todays visit.    Orders Placed This Encounter  Procedures   CBC with Differential/Platelet   CMP14+EGFR   Lipid Profile    Meds ordered this encounter  Medications   ALPRAZolam (XANAX) 0.25 MG tablet    Sig: Take 1 tablet (0.25 mg total) by mouth at bedtime as needed for anxiety.    Dispense:  30 tablet    Refill:  2   estrogen, conjugated,-medroxyprogesterone (PREMPRO) 0.3-1.5 MG tablet    Sig: Take 1 tablet by mouth daily.    Dispense:  84 tablet    Refill:  1    Please fill for 90 days   amLODipine (NORVASC) 5 MG tablet    Sig: Take 1 tablet (5 mg total) by mouth daily.     Dispense:  90 tablet    Refill:  1    For next fill   lisinopril (ZESTRIL) 40 MG tablet    Sig: Take 1 tablet (40 mg total) by mouth daily.  Dispense:  90 tablet    Refill:  1    For next fill   simvastatin (ZOCOR) 20 MG tablet    Sig: Take 1 tablet (20 mg total) by mouth daily at 6 PM.    Dispense:  90 tablet    Refill:  1    For next fill    Return in about 3 months (around 02/01/2023) for needs medicare wellness visit and refills, have labs done prior to visit .   Total time spent:30 Minutes Time spent includes review of chart, medications, test results, and follow up plan with the patient.   [place] Controlled Substance Database was reviewed by me.  This patient was seen by Sallyanne Kuster, FNP-C in collaboration with Dr. Beverely Risen as a part of collaborative care agreement.    R. Tedd Sias, MSN, FNP-C Internal medicine

## 2022-11-10 ENCOUNTER — Other Ambulatory Visit: Payer: Self-pay

## 2022-11-13 ENCOUNTER — Other Ambulatory Visit: Payer: Self-pay

## 2022-11-14 ENCOUNTER — Other Ambulatory Visit: Payer: Self-pay

## 2022-11-15 ENCOUNTER — Other Ambulatory Visit: Payer: Self-pay

## 2022-11-18 ENCOUNTER — Encounter: Payer: Self-pay | Admitting: Nurse Practitioner

## 2022-11-22 ENCOUNTER — Other Ambulatory Visit: Payer: Self-pay

## 2022-12-03 ENCOUNTER — Encounter: Payer: Self-pay | Admitting: Nurse Practitioner

## 2022-12-04 ENCOUNTER — Other Ambulatory Visit: Payer: Self-pay

## 2022-12-04 DIAGNOSIS — M1712 Unilateral primary osteoarthritis, left knee: Secondary | ICD-10-CM | POA: Diagnosis not present

## 2022-12-04 MED ORDER — MELOXICAM 15 MG PO TABS
15.0000 mg | ORAL_TABLET | Freq: Every day | ORAL | 2 refills | Status: DC
  Filled 2022-12-04 (×2): qty 30, 30d supply, fill #0
  Filled 2023-01-08: qty 30, 30d supply, fill #1
  Filled 2023-03-18: qty 30, 30d supply, fill #2

## 2022-12-11 ENCOUNTER — Other Ambulatory Visit: Payer: Self-pay

## 2022-12-12 ENCOUNTER — Other Ambulatory Visit: Payer: Self-pay

## 2022-12-18 ENCOUNTER — Other Ambulatory Visit: Payer: Self-pay

## 2022-12-24 DIAGNOSIS — G4733 Obstructive sleep apnea (adult) (pediatric): Secondary | ICD-10-CM | POA: Diagnosis not present

## 2023-01-09 ENCOUNTER — Other Ambulatory Visit: Payer: Self-pay

## 2023-01-18 DIAGNOSIS — M8580 Other specified disorders of bone density and structure, unspecified site: Secondary | ICD-10-CM | POA: Diagnosis not present

## 2023-01-18 DIAGNOSIS — N951 Menopausal and female climacteric states: Secondary | ICD-10-CM | POA: Diagnosis not present

## 2023-01-18 DIAGNOSIS — I10 Essential (primary) hypertension: Secondary | ICD-10-CM | POA: Diagnosis not present

## 2023-01-18 DIAGNOSIS — E782 Mixed hyperlipidemia: Secondary | ICD-10-CM | POA: Diagnosis not present

## 2023-01-19 LAB — CBC WITH DIFFERENTIAL/PLATELET
Basophils Absolute: 0 10*3/uL (ref 0.0–0.2)
Basos: 1 %
EOS (ABSOLUTE): 0.1 10*3/uL (ref 0.0–0.4)
Eos: 3 %
Hematocrit: 39.5 % (ref 34.0–46.6)
Hemoglobin: 12.7 g/dL (ref 11.1–15.9)
Immature Grans (Abs): 0 10*3/uL (ref 0.0–0.1)
Immature Granulocytes: 0 %
Lymphocytes Absolute: 1.5 10*3/uL (ref 0.7–3.1)
Lymphs: 35 %
MCH: 30.6 pg (ref 26.6–33.0)
MCHC: 32.2 g/dL (ref 31.5–35.7)
MCV: 95 fL (ref 79–97)
Monocytes Absolute: 0.3 10*3/uL (ref 0.1–0.9)
Monocytes: 8 %
Neutrophils Absolute: 2.2 10*3/uL (ref 1.4–7.0)
Neutrophils: 53 %
Platelets: 289 10*3/uL (ref 150–450)
RBC: 4.15 x10E6/uL (ref 3.77–5.28)
RDW: 12.4 % (ref 11.7–15.4)
WBC: 4.1 10*3/uL (ref 3.4–10.8)

## 2023-01-19 LAB — CMP14+EGFR
ALT: 21 [IU]/L (ref 0–32)
AST: 23 [IU]/L (ref 0–40)
Albumin: 4.4 g/dL (ref 3.9–4.9)
Alkaline Phosphatase: 76 [IU]/L (ref 44–121)
BUN/Creatinine Ratio: 12 (ref 12–28)
BUN: 9 mg/dL (ref 8–27)
Bilirubin Total: 0.3 mg/dL (ref 0.0–1.2)
CO2: 21 mmol/L (ref 20–29)
Calcium: 9.2 mg/dL (ref 8.7–10.3)
Chloride: 100 mmol/L (ref 96–106)
Creatinine, Ser: 0.73 mg/dL (ref 0.57–1.00)
Globulin, Total: 2.4 g/dL (ref 1.5–4.5)
Glucose: 93 mg/dL (ref 70–99)
Potassium: 4.4 mmol/L (ref 3.5–5.2)
Sodium: 138 mmol/L (ref 134–144)
Total Protein: 6.8 g/dL (ref 6.0–8.5)
eGFR: 90 mL/min/{1.73_m2} (ref >59)

## 2023-01-19 LAB — LIPID PANEL
Chol/HDL Ratio: 2.5 {ratio} (ref 0.0–4.4)
Cholesterol, Total: 153 mg/dL (ref 100–199)
HDL: 61 mg/dL (ref >39)
LDL Chol Calc (NIH): 78 mg/dL (ref 0–99)
Triglycerides: 73 mg/dL (ref 0–149)
VLDL Cholesterol Cal: 14 mg/dL (ref 5–40)

## 2023-02-05 ENCOUNTER — Ambulatory Visit: Payer: Medicare Other | Admitting: Nurse Practitioner

## 2023-02-05 ENCOUNTER — Other Ambulatory Visit: Payer: Self-pay

## 2023-02-05 ENCOUNTER — Encounter: Payer: Self-pay | Admitting: Nurse Practitioner

## 2023-02-05 VITALS — BP 140/88 | HR 61 | Temp 98.9°F | Resp 16 | Ht 66.0 in | Wt 185.6 lb

## 2023-02-05 DIAGNOSIS — M8580 Other specified disorders of bone density and structure, unspecified site: Secondary | ICD-10-CM

## 2023-02-05 DIAGNOSIS — I10 Essential (primary) hypertension: Secondary | ICD-10-CM

## 2023-02-05 DIAGNOSIS — N951 Menopausal and female climacteric states: Secondary | ICD-10-CM

## 2023-02-05 DIAGNOSIS — Z0000 Encounter for general adult medical examination without abnormal findings: Secondary | ICD-10-CM

## 2023-02-05 DIAGNOSIS — G4700 Insomnia, unspecified: Secondary | ICD-10-CM

## 2023-02-05 DIAGNOSIS — E782 Mixed hyperlipidemia: Secondary | ICD-10-CM | POA: Diagnosis not present

## 2023-02-05 MED ORDER — METOPROLOL SUCCINATE ER 100 MG PO TB24
100.0000 mg | ORAL_TABLET | Freq: Every day | ORAL | 1 refills | Status: DC
  Filled 2023-02-05 – 2023-03-08 (×2): qty 90, 90d supply, fill #0
  Filled 2023-06-13: qty 90, 90d supply, fill #1

## 2023-02-05 MED ORDER — ALPRAZOLAM 0.5 MG PO TABS
0.5000 mg | ORAL_TABLET | Freq: Every evening | ORAL | 2 refills | Status: DC | PRN
Start: 2023-02-05 — End: 2023-04-30
  Filled 2023-02-05: qty 30, 30d supply, fill #0
  Filled 2023-03-08: qty 30, 30d supply, fill #1
  Filled 2023-03-31 – 2023-04-05 (×2): qty 30, 30d supply, fill #2

## 2023-02-05 MED ORDER — CONJ ESTROG-MEDROXYPROGEST ACE 0.3-1.5 MG PO TABS
1.0000 | ORAL_TABLET | Freq: Every day | ORAL | 1 refills | Status: DC
  Filled 2023-02-05: qty 84, 84d supply, fill #0
  Filled 2023-05-07: qty 28, 28d supply, fill #1

## 2023-02-05 MED ORDER — AMLODIPINE BESYLATE 5 MG PO TABS
5.0000 mg | ORAL_TABLET | Freq: Every day | ORAL | 1 refills | Status: DC
Start: 2023-02-05 — End: 2023-07-26
  Filled 2023-02-05: qty 90, 90d supply, fill #0
  Filled 2023-05-07: qty 90, 90d supply, fill #1

## 2023-02-05 MED ORDER — LISINOPRIL 40 MG PO TABS
40.0000 mg | ORAL_TABLET | Freq: Every day | ORAL | 1 refills | Status: DC
Start: 2023-02-05 — End: 2023-07-26
  Filled 2023-02-05 – 2023-03-08 (×2): qty 90, 90d supply, fill #0
  Filled 2023-06-13: qty 90, 90d supply, fill #1

## 2023-02-05 MED ORDER — SIMVASTATIN 20 MG PO TABS
20.0000 mg | ORAL_TABLET | Freq: Every day | ORAL | 1 refills | Status: DC
  Filled 2023-02-05 – 2023-03-18 (×2): qty 90, 90d supply, fill #0
  Filled 2023-06-18: qty 90, 90d supply, fill #1

## 2023-02-05 NOTE — Progress Notes (Cosign Needed)
[facility] [address] [place], [place] [address] [phone]  Patient Name: Alejandra Castro DOB: 12/18/1955 MRN: [id number]  Date of Service: 02/05/23   Medicare Annual Wellness Visit (Subsequent)  PCP: Sallyanne Kuster, NP Pulmonologist: Dr. Freda Munro  Gastroenterologist: Dr. Lannette Donath Orthopedic: Dr. Hyacinth Meeker at [facility] Dermatologist: Dr. Ebony Cargo.     Chief Complaint  Patient presents with   Hyperlipidemia   Hypertension   Medicare Wellness   HPI Alejandra Castro presents for a medicare annual well visit. Well-appearing 67 y.o. female with hypertension, OSA, asthma, osteopenia, high cholesterol, anxiety, and depression.  Routine CRC screening: due in 2033 Routine mammogram: done in may this year  DEXA scan: done in may this year.  Labs: recent lab results reviewed with patient.  New or worsening pain: none Other concerns: none     02/05/2023   10:26 AM 10/28/2021    2:28 PM  MMSE - Mini Mental State Exam  Not completed:  Unable to complete  Orientation to time 5   Orientation to Place 5   Registration 3   Attention/ Calculation 5   Recall 3   Language- name 2 objects 2   Language- repeat 1   Language- follow 3 step command 3   Language- read & follow direction 1   Write a sentence 1   Copy design 1   Total score 30     Functional Status Survey: Is the patient deaf or have difficulty hearing?: No Does the patient have difficulty seeing, even when wearing glasses/contacts?: No Does the patient have difficulty concentrating, remembering, or making decisions?: No Does the patient have difficulty walking or climbing stairs?: No Does the patient have difficulty dressing or bathing?: No Does the patient have difficulty doing errands alone such as visiting a doctor's office or shopping?: No     10/11/2021    6:20 AM 12/02/2021   10:49 AM 02/16/2022    9:55 AM 06/20/2022    3:23 PM 02/05/2023   10:25 AM  Fall Risk  Falls in the  past year?  0  0 0  Was there an injury with Fall?  0  0 0  Fall Risk Category Calculator  0  0 0  Fall Risk Category (Retired)  Low     (RETIRED) Patient Fall Risk Level Low fall risk Low fall risk Low fall risk    Patient at Risk for Falls Due to  No Fall Risks  No Fall Risks No Fall Risks  Fall risk Follow up  Falls evaluation completed  Falls evaluation completed Falls evaluation completed       02/05/2023   10:25 AM  Depression screen PHQ 2/9  Decreased Interest 0  Down, Depressed, Hopeless 0  PHQ - 2 Score 0       12/23/2021   10:52 AM  GAD 7 : Generalized Anxiety Score  Nervous, Anxious, on Edge 3  Control/stop worrying 3  Worry too much - different things 1  Trouble relaxing 1  Restless 2  Easily annoyed or irritable 2  Afraid - awful might happen 0  Total GAD 7 Score 12  Anxiety Difficulty Not difficult at all      Current Medication: Outpatient Encounter Medications as of 02/05/2023  Medication Sig   albuterol (VENTOLIN HFA) 108 (90 Base) MCG/ACT inhaler Inhale 2 puffs into the lungs every 4 (four) hours as needed for wheezing or shortness of breath.   ALPRAZolam (XANAX) 0.5 MG tablet Take 1 tablet (0.5 mg total) by  mouth at bedtime as needed for anxiety or sleep.   aspirin EC 81 MG tablet Take 81 mg by mouth daily.   Calcium Carb-Cholecalciferol 600-10 MG-MCG TABS Take by mouth.   Cholecalciferol 50 MCG (2000 UT) TABS Take by mouth.   fluticasone (FLONASE) 50 MCG/ACT nasal spray Place 2 sprays into both nostrils daily.   magnesium oxide (MAG-OX) 400 MG tablet Take by mouth.   meclizine (ANTIVERT) 25 MG tablet Take by mouth.   meloxicam (MOBIC) 15 MG tablet Take 1 tablet (15 mg total) by mouth daily with a meal   Multiple Vitamin (MULTIVITAMIN WITH MINERALS) TABS tablet Take 1 tablet by mouth daily.   Omega-3 Fatty Acids (FISH OIL PO) Take 2 capsules by mouth daily.   [DISCONTINUED] ALPRAZolam (XANAX) 0.25 MG tablet Take 1 tablet (0.25 mg total) by mouth at  bedtime as needed for anxiety.   [DISCONTINUED] amLODipine (NORVASC) 5 MG tablet Take 1 tablet (5 mg total) by mouth daily.   [DISCONTINUED] estrogen, conjugated,-medroxyprogesterone (PREMPRO) 0.3-1.5 MG tablet Take 1 tablet by mouth daily.   [DISCONTINUED] lisinopril (ZESTRIL) 40 MG tablet Take 1 tablet (40 mg total) by mouth daily.   [DISCONTINUED] metoprolol succinate (TOPROL-XL) 100 MG 24 hr tablet Take 1 tablet (100 mg total) by mouth daily.   [DISCONTINUED] simvastatin (ZOCOR) 20 MG tablet Take 1 tablet (20 mg total) by mouth daily at 6 PM.   amLODipine (NORVASC) 5 MG tablet Take 1 tablet (5 mg total) by mouth daily.   estrogen, conjugated,-medroxyprogesterone (PREMPRO) 0.3-1.5 MG tablet Take 1 tablet by mouth daily.   lisinopril (ZESTRIL) 40 MG tablet Take 1 tablet (40 mg total) by mouth daily.   metoprolol succinate (TOPROL-XL) 100 MG 24 hr tablet Take 1 tablet (100 mg total) by mouth daily.   simvastatin (ZOCOR) 20 MG tablet Take 1 tablet (20 mg total) by mouth daily at 6 PM.   [DISCONTINUED] felodipine (PLENDIL) 5 MG 24 hr tablet Take 5 mg by mouth at bedtime.    [DISCONTINUED] FLUoxetine (PROZAC) 20 MG capsule Take 20-40 mg by mouth every morning. USUALLY JUST TAKES 1 BUT WILL TAKE 2 IF NEEDED    No facility-administered encounter medications on file as of 02/05/2023.    Surgical History: Past Surgical History:  Procedure Laterality Date   COLONOSCOPY     COLONOSCOPY WITH PROPOFOL N/A 02/16/2022   Procedure: COLONOSCOPY WITH PROPOFOL;  Surgeon: Toney Reil, MD;  Location: [facility] ENDOSCOPY;  Service: Gastroenterology;  Laterality: N/A;    Medical History: Past Medical History:  Diagnosis Date   Arthritis    Hyperlipidemia    Hypertension     Family History: Family History  Problem Relation Age of Onset   Breast cancer Neg Hx     Social History   Socioeconomic History   Marital status: Widowed    Spouse name: Not on file   Number of children: Not on file    Years of education: Not on file   Highest education level: Not on file  Occupational History   Not on file  Tobacco Use   Smoking status: Never   Smokeless tobacco: Never  Vaping Use   Vaping status: Never Used  Substance and Sexual Activity   Alcohol use: Yes    Alcohol/week: 14.0 standard drinks of alcohol    Types: 14 Cans of beer per week    Comment: rarely   Drug use: No   Sexual activity: Not Currently  Other Topics Concern   Not on file  Social  History Narrative   Not on file   Social Drivers of Health   Financial Resource Strain: Low Risk  (10/28/2021)   Overall Financial Resource Strain (CARDIA)    Difficulty of Paying Living Expenses: Not hard at all  Food Insecurity: No Food Insecurity (10/28/2021)   Hunger Vital Sign    Worried About Running Out of Food in the Last Year: Never true    Ran Out of Food in the Last Year: Never true  Transportation Needs: No Transportation Needs (10/28/2021)   PRAPARE - Administrator, Civil Service (Medical): No    Lack of Transportation (Non-Medical): No  Physical Activity: Sufficiently Active (10/28/2021)   Exercise Vital Sign    Days of Exercise per Week: 4 days    Minutes of Exercise per Session: 40 min  Stress: No Stress Concern Present (10/28/2021)   [employer] of Occupational Health - Occupational Stress Questionnaire    Feeling of Stress : Not at all  Social Connections: Socially Isolated (10/28/2021)   Social Connection and Isolation Panel [NHANES]    Frequency of Communication with Friends and Family: More than three times a week    Frequency of Social Gatherings with Friends and Family: More than three times a week    Attends Religious Services: Never    Database administrator or Organizations: No    Attends Banker Meetings: Never    Marital Status: Divorced  Catering manager Violence: Not At Risk (10/28/2021)   Humiliation, Afraid, Rape, and Kick questionnaire    Fear of Current or  Ex-Partner: No    Emotionally Abused: No    Physically Abused: No    Sexually Abused: No      Review of Systems  Constitutional:  Negative for activity change, appetite change, chills, fatigue, fever and unexpected weight change.  HENT: Negative.  Negative for congestion, ear pain, rhinorrhea, sore throat and trouble swallowing.   Eyes: Negative.   Respiratory: Negative.  Negative for cough, chest tightness, shortness of breath and wheezing.   Cardiovascular: Negative.  Negative for chest pain and palpitations.  Gastrointestinal: Negative.  Negative for abdominal pain, blood in stool, constipation, diarrhea, nausea and vomiting.  Endocrine: Negative.   Genitourinary: Negative.  Negative for difficulty urinating, dysuria, frequency, hematuria and urgency.  Musculoskeletal: Negative.  Negative for arthralgias, back pain, joint swelling, myalgias and neck pain.  Skin: Negative.  Negative for rash and wound.  Allergic/Immunologic: Negative.  Negative for immunocompromised state.  Neurological: Negative.  Negative for dizziness, seizures, numbness and headaches.  Hematological: Negative.   Psychiatric/Behavioral: Negative.  Negative for behavioral problems, self-injury and suicidal ideas. The patient is not nervous/anxious.     Vital Signs: BP (!) 140/88   Pulse 61   Temp 98.9 F (37.2 C)   Resp 16   Ht 5\' 6"  (1.676 m)   Wt 185 lb 9.6 oz (84.2 kg)   SpO2 96%   BMI 29.96 kg/m    Physical Exam Vitals reviewed.  Constitutional:      General: She is not in acute distress.    Appearance: Normal appearance. She is obese. She is not ill-appearing.  HENT:     Head: Normocephalic and atraumatic.  Eyes:     Pupils: Pupils are equal, round, and reactive to light.  Cardiovascular:     Rate and Rhythm: Normal rate and regular rhythm.     Heart sounds: Normal heart sounds. No murmur heard. Pulmonary:     Effort: Pulmonary effort is  normal. No respiratory distress.     Breath sounds:  Normal breath sounds. No wheezing.  Skin:    General: Skin is warm and dry.     Capillary Refill: Capillary refill takes less than 2 seconds.  Neurological:     Mental Status: She is alert and oriented to person, place, and time.  Psychiatric:        Mood and Affect: Mood normal.        Behavior: Behavior normal.        Assessment/Plan: 1. Encounter for subsequent annual wellness visit (AWV) in Medicare patient (Primary) Age-appropriate preventive screenings and vaccinations discussed, annual physical exam completed. Routine labs for health maintenance results reviewed with the patient today. PHM updated.    2. Benign essential hypertension Stable, continue lisinopril, amlodipine, and metoprolol as prescribed.  - lisinopril (ZESTRIL) 40 MG tablet; Take 1 tablet (40 mg total) by mouth daily.  Dispense: 90 tablet; Refill: 1 - amLODipine (NORVASC) 5 MG tablet; Take 1 tablet (5 mg total) by mouth daily.  Dispense: 90 tablet; Refill: 1 - metoprolol succinate (TOPROL-XL) 100 MG 24 hr tablet; Take 1 tablet (100 mg total) by mouth daily.  Dispense: 90 tablet; Refill: 1  3. Mixed hyperlipidemia Continue simvastatin as prescribed.  - simvastatin (ZOCOR) 20 MG tablet; Take 1 tablet (20 mg total) by mouth daily at 6 PM.  Dispense: 90 tablet; Refill: 1  4. Vasomotor symptoms due to menopause Continue prempro as prescribed  - estrogen, conjugated,-medroxyprogesterone (PREMPRO) 0.3-1.5 MG tablet; Take 1 tablet by mouth daily.  Dispense: 84 tablet; Refill: 1  5. Osteopenia, unspecified location Continue prempro as prescribed.  - estrogen, conjugated,-medroxyprogesterone (PREMPRO) 0.3-1.5 MG tablet; Take 1 tablet by mouth daily.  Dispense: 84 tablet; Refill: 1  6. Insomnia, unspecified type Continue alprazolam as prescribed. Follow up in 3 months for additional refills  - ALPRAZolam (XANAX) 0.5 MG tablet; Take 1 tablet (0.5 mg total) by mouth at bedtime as needed for anxiety or sleep.   Dispense: 30 tablet; Refill: 2    General Counseling: charise koegel understanding of the findings of todays visit and agrees with plan of treatment. I have discussed any further diagnostic evaluation that may be needed or ordered today. We also reviewed her medications today. she has been encouraged to call the office with any questions or concerns that should arise related to todays visit.    No orders of the defined types were placed in this encounter.   Meds ordered this encounter  Medications   ALPRAZolam (XANAX) 0.5 MG tablet    Sig: Take 1 tablet (0.5 mg total) by mouth at bedtime as needed for anxiety or sleep.    Dispense:  30 tablet    Refill:  2    Note increased dose, fill new script today.   lisinopril (ZESTRIL) 40 MG tablet    Sig: Take 1 tablet (40 mg total) by mouth daily.    Dispense:  90 tablet    Refill:  1    For next fill   amLODipine (NORVASC) 5 MG tablet    Sig: Take 1 tablet (5 mg total) by mouth daily.    Dispense:  90 tablet    Refill:  1    For next fill   simvastatin (ZOCOR) 20 MG tablet    Sig: Take 1 tablet (20 mg total) by mouth daily at 6 PM.    Dispense:  90 tablet    Refill:  1    For next fill  metoprolol succinate (TOPROL-XL) 100 MG 24 hr tablet    Sig: Take 1 tablet (100 mg total) by mouth daily.    Dispense:  90 tablet    Refill:  1    For next fill   estrogen, conjugated,-medroxyprogesterone (PREMPRO) 0.3-1.5 MG tablet    Sig: Take 1 tablet by mouth daily.    Dispense:  84 tablet    Refill:  1    Please fill for 90 days    Return in about 3 months (around 05/01/2023) for F/U, anxiety med refill, Rifka Ramey PCP.   Total time spent:30 Minutes Time spent includes review of chart, medications, test results, and follow up plan with the patient.    Controlled Substance Database was reviewed by me.  This patient was seen by Sallyanne Kuster, FNP-C in collaboration with Dr. Beverely Risen as a part of collaborative care  agreement.  Naleyah Ohlinger R. Tedd Sias, MSN, FNP-C Internal medicine

## 2023-02-07 ENCOUNTER — Encounter: Payer: Self-pay | Admitting: Nurse Practitioner

## 2023-02-07 ENCOUNTER — Other Ambulatory Visit: Payer: Self-pay

## 2023-03-09 ENCOUNTER — Other Ambulatory Visit: Payer: Self-pay

## 2023-03-19 ENCOUNTER — Other Ambulatory Visit: Payer: Self-pay

## 2023-03-25 DIAGNOSIS — G4733 Obstructive sleep apnea (adult) (pediatric): Secondary | ICD-10-CM | POA: Diagnosis not present

## 2023-04-01 ENCOUNTER — Encounter: Payer: Self-pay | Admitting: Nurse Practitioner

## 2023-04-01 ENCOUNTER — Other Ambulatory Visit: Payer: Self-pay

## 2023-04-03 ENCOUNTER — Other Ambulatory Visit: Payer: Self-pay

## 2023-04-06 ENCOUNTER — Other Ambulatory Visit: Payer: Self-pay

## 2023-04-30 ENCOUNTER — Encounter: Payer: Self-pay | Admitting: Nurse Practitioner

## 2023-04-30 ENCOUNTER — Ambulatory Visit (INDEPENDENT_AMBULATORY_CARE_PROVIDER_SITE_OTHER): Payer: Medicare Other | Admitting: Nurse Practitioner

## 2023-04-30 ENCOUNTER — Other Ambulatory Visit: Payer: Self-pay

## 2023-04-30 VITALS — BP 130/86 | HR 60 | Temp 98.7°F | Resp 16 | Ht 66.0 in | Wt 179.8 lb

## 2023-04-30 DIAGNOSIS — E782 Mixed hyperlipidemia: Secondary | ICD-10-CM | POA: Diagnosis not present

## 2023-04-30 DIAGNOSIS — G4700 Insomnia, unspecified: Secondary | ICD-10-CM | POA: Diagnosis not present

## 2023-04-30 DIAGNOSIS — I10 Essential (primary) hypertension: Secondary | ICD-10-CM

## 2023-04-30 DIAGNOSIS — H52223 Regular astigmatism, bilateral: Secondary | ICD-10-CM | POA: Diagnosis not present

## 2023-04-30 MED ORDER — ALPRAZOLAM 0.5 MG PO TABS
0.5000 mg | ORAL_TABLET | Freq: Every evening | ORAL | 2 refills | Status: DC | PRN
  Filled 2023-04-30 – 2023-05-04 (×3): qty 30, 30d supply, fill #0
  Filled 2023-05-29 – 2023-06-02 (×2): qty 30, 30d supply, fill #1
  Filled 2023-07-03: qty 30, 30d supply, fill #2

## 2023-04-30 NOTE — Progress Notes (Signed)
[facility] [address] [place], [place] [address]  Internal MEDICINE  Office Visit Note  Patient Name: Alejandra Castro  [id number]  [id number]  Date of Service: 04/30/2023  Chief Complaint  Patient presents with   Hyperlipidemia   Hypertension   Follow-up    HPI Dashanti presents for a follow-up visit for insomnia, hypertension, high cholesterol and medication refills.  Insomnia -- taking alprazolam 0.5 mg at bedtime, this dose was increased at her last office visit and has been helping some. She does report having some nights that she still doesn't get much sleep or that the effects of the alprazolam seems to continue into the daytime. Reports that she has never tried any other medications for sleep before. Also could be effected by age, menopause, or medication side effects Hypertension -- controlled with lisinopril, metoprolol and amlodipine.  High cholesterol -- currently taking simvastatin and fish oil supplement Arthritis -- takes meloxicam as needed.  Intermittent dry hacking cough -- possible side effect of lisinopril. Happens sometimes but not consistent or continuously.  Still needs to get her pneumonia vaccine. -- plans to discuss with her pharmacy.     Current Medication: Outpatient Encounter Medications as of 04/30/2023  Medication Sig   albuterol (VENTOLIN HFA) 108 (90 Base) MCG/ACT inhaler Inhale 2 puffs into the lungs every 4 (four) hours as needed for wheezing or shortness of breath.   amLODipine (NORVASC) 5 MG tablet Take 1 tablet (5 mg total) by mouth daily.   aspirin EC 81 MG tablet Take 81 mg by mouth daily.   Calcium Carb-Cholecalciferol 600-10 MG-MCG TABS Take by mouth.   Cholecalciferol 50 MCG (2000 UT) TABS Take by mouth.   estrogen, conjugated,-medroxyprogesterone (PREMPRO) 0.3-1.5 MG tablet Take 1 tablet by mouth daily.   fluticasone (FLONASE) 50 MCG/ACT nasal spray Place 2 sprays into both nostrils daily.   lisinopril (ZESTRIL) 40 MG  tablet Take 1 tablet (40 mg total) by mouth daily.   magnesium oxide (MAG-OX) 400 MG tablet Take by mouth.   meclizine (ANTIVERT) 25 MG tablet Take by mouth.   meloxicam (MOBIC) 15 MG tablet Take 1 tablet (15 mg total) by mouth daily with a meal   metoprolol succinate (TOPROL-XL) 100 MG 24 hr tablet Take 1 tablet (100 mg total) by mouth daily.   Multiple Vitamin (MULTIVITAMIN WITH MINERALS) TABS tablet Take 1 tablet by mouth daily.   Omega-3 Fatty Acids (FISH OIL PO) Take 2 capsules by mouth daily.   simvastatin (ZOCOR) 20 MG tablet Take 1 tablet (20 mg total) by mouth daily at 6 PM.   [DISCONTINUED] ALPRAZolam (XANAX) 0.5 MG tablet Take 1 tablet (0.5 mg total) by mouth at bedtime as needed for anxiety or sleep.   ALPRAZolam (XANAX) 0.5 MG tablet Take 1 tablet (0.5 mg total) by mouth at bedtime as needed for anxiety or sleep.   [DISCONTINUED] felodipine (PLENDIL) 5 MG 24 hr tablet Take 5 mg by mouth at bedtime.    [DISCONTINUED] FLUoxetine (PROZAC) 20 MG capsule Take 20-40 mg by mouth every morning. USUALLY JUST TAKES 1 BUT WILL TAKE 2 IF NEEDED    No facility-administered encounter medications on file as of 04/30/2023.    Surgical History: Past Surgical History:  Procedure Laterality Date   COLONOSCOPY     COLONOSCOPY WITH PROPOFOL N/A 02/16/2022   Procedure: COLONOSCOPY WITH PROPOFOL;  Surgeon: Toney Reil, MD;  Location: [facility] ENDOSCOPY;  Service: Gastroenterology;  Laterality: N/A;    Medical History: Past Medical History:  Diagnosis  Date   Arthritis    Hyperlipidemia    Hypertension     Family History: Family History  Problem Relation Age of Onset   Breast cancer Neg Hx     Social History   Socioeconomic History   Marital status: Widowed    Spouse name: Not on file   Number of children: Not on file   Years of education: Not on file   Highest education level: Not on file  Occupational History   Not on file  Tobacco Use   Smoking status: Never   Smokeless  tobacco: Never  Vaping Use   Vaping status: Never Used  Substance and Sexual Activity   Alcohol use: Yes    Alcohol/week: 14.0 standard drinks of alcohol    Types: 14 Cans of beer per week    Comment: rarely   Drug use: No   Sexual activity: Not Currently  Other Topics Concern   Not on file  Social History Narrative   Not on file   Social Drivers of Health   Financial Resource Strain: Low Risk  (10/28/2021)   Overall Financial Resource Strain (CARDIA)    Difficulty of Paying Living Expenses: Not hard at all  Food Insecurity: No Food Insecurity (10/28/2021)   Hunger Vital Sign    Worried About Running Out of Food in the Last Year: Never true    Ran Out of Food in the Last Year: Never true  Transportation Needs: No Transportation Needs (10/28/2021)   PRAPARE - Administrator, Civil Service (Medical): No    Lack of Transportation (Non-Medical): No  Physical Activity: Sufficiently Active (10/28/2021)   Exercise Vital Sign    Days of Exercise per Week: 4 days    Minutes of Exercise per Session: 40 min  Stress: No Stress Concern Present (10/28/2021)   [employer] of Occupational Health - Occupational Stress Questionnaire    Feeling of Stress : Not at all  Social Connections: Socially Isolated (10/28/2021)   Social Connection and Isolation Panel [NHANES]    Frequency of Communication with Friends and Family: More than three times a week    Frequency of Social Gatherings with Friends and Family: More than three times a week    Attends Religious Services: Never    Database administrator or Organizations: No    Attends Banker Meetings: Never    Marital Status: Divorced  Catering manager Violence: Not At Risk (10/28/2021)   Humiliation, Afraid, Rape, and Kick questionnaire    Fear of Current or Ex-Partner: No    Emotionally Abused: No    Physically Abused: No    Sexually Abused: No      Review of Systems  Constitutional:  Negative for chills, fatigue  and unexpected weight change.  HENT:  Negative for congestion, rhinorrhea, sneezing and sore throat.   Respiratory: Negative.  Negative for cough, chest tightness, shortness of breath and wheezing.   Cardiovascular: Negative.  Negative for chest pain and palpitations.  Gastrointestinal: Negative.  Negative for abdominal pain, constipation, diarrhea, nausea and vomiting.  Genitourinary:  Negative for dysuria and frequency.  Musculoskeletal:  Negative for arthralgias, back pain, joint swelling and neck pain.  Skin:  Negative for rash.  Neurological: Negative.   Psychiatric/Behavioral:  Positive for sleep disturbance. Negative for behavioral problems (Depression), self-injury and suicidal ideas. The patient is nervous/anxious.     Vital Signs: BP 130/86   Pulse 60   Temp 98.7 F (37.1 C)   Resp 16  Ht 5\' 6"  (1.676 m)   Wt 179 lb 12.8 oz (81.6 kg)   SpO2 98%   BMI 29.02 kg/m    Physical Exam Vitals reviewed.  Constitutional:      General: She is not in acute distress.    Appearance: Normal appearance. She is obese. She is not ill-appearing.  HENT:     Head: Normocephalic and atraumatic.  Eyes:     Pupils: Pupils are equal, round, and reactive to light.  Cardiovascular:     Rate and Rhythm: Normal rate and regular rhythm.  Pulmonary:     Effort: Pulmonary effort is normal. No respiratory distress.  Neurological:     Mental Status: She is alert and oriented to person, place, and time.  Psychiatric:        Mood and Affect: Mood normal.        Behavior: Behavior normal.        Assessment/Plan: 1. Benign essential hypertension (Primary) Stable, continue lisinopril, amlodipine and metoprolol as prescribed.   2. Mixed hyperlipidemia Continue simvastatin and OTC fish oil supplement as prescribed   3. Insomnia, unspecified type Continue alprazole prn at bedtime as prescribe.d follow up in 3 months for additional refills. If still feeling tired or having difficulty  sleeping, will discuss alternative medication that she can use for sleep.  - ALPRAZolam (XANAX) 0.5 MG tablet; Take 1 tablet (0.5 mg total) by mouth at bedtime as needed for anxiety or sleep.  Dispense: 30 tablet; Refill: 2   General Counseling: yashika magalhaes understanding of the findings of todays visit and agrees with plan of treatment. I have discussed any further diagnostic evaluation that may be needed or ordered today. We also reviewed her medications today. she has been encouraged to call the office with any questions or concerns that should arise related to todays visit.    No orders of the defined types were placed in this encounter.   Meds ordered this encounter  Medications   ALPRAZolam (XANAX) 0.5 MG tablet    Sig: Take 1 tablet (0.5 mg total) by mouth at bedtime as needed for anxiety or sleep.    Dispense:  30 tablet    Refill:  2    Refills, keep on file.    Return in about 3 months (around 07/25/2023) for F/U, anxiety med refill, Joesphine Schemm PCP.   Total time spent:30 Minutes Time spent includes review of chart, medications, test results, and follow up plan with the patient.   [place] Controlled Substance Database was reviewed by me.  This patient was seen by Sallyanne Kuster, FNP-C in collaboration with Dr. Beverely Risen as a part of collaborative care agreement.   Vernette Moise R. Tedd Sias, MSN, FNP-C Internal medicine

## 2023-05-01 ENCOUNTER — Other Ambulatory Visit: Payer: Self-pay

## 2023-05-04 ENCOUNTER — Other Ambulatory Visit: Payer: Self-pay

## 2023-05-08 ENCOUNTER — Other Ambulatory Visit: Payer: Self-pay

## 2023-05-10 ENCOUNTER — Encounter: Payer: Self-pay | Admitting: Nurse Practitioner

## 2023-05-10 ENCOUNTER — Other Ambulatory Visit: Payer: Self-pay

## 2023-05-30 ENCOUNTER — Other Ambulatory Visit: Payer: Self-pay

## 2023-06-04 ENCOUNTER — Other Ambulatory Visit: Payer: Self-pay

## 2023-06-23 DIAGNOSIS — G4733 Obstructive sleep apnea (adult) (pediatric): Secondary | ICD-10-CM | POA: Diagnosis not present

## 2023-07-03 ENCOUNTER — Other Ambulatory Visit: Payer: Self-pay

## 2023-07-26 ENCOUNTER — Ambulatory Visit: Payer: Medicare Other | Admitting: Nurse Practitioner

## 2023-07-26 ENCOUNTER — Encounter: Payer: Self-pay | Admitting: Nurse Practitioner

## 2023-07-26 ENCOUNTER — Other Ambulatory Visit: Payer: Self-pay

## 2023-07-26 VITALS — BP 118/76 | HR 62 | Temp 98.5°F | Resp 16 | Ht 66.0 in | Wt 185.8 lb

## 2023-07-26 DIAGNOSIS — G4700 Insomnia, unspecified: Secondary | ICD-10-CM

## 2023-07-26 DIAGNOSIS — I10 Essential (primary) hypertension: Secondary | ICD-10-CM | POA: Diagnosis not present

## 2023-07-26 DIAGNOSIS — E782 Mixed hyperlipidemia: Secondary | ICD-10-CM | POA: Diagnosis not present

## 2023-07-26 DIAGNOSIS — R5383 Other fatigue: Secondary | ICD-10-CM | POA: Diagnosis not present

## 2023-07-26 DIAGNOSIS — N951 Menopausal and female climacteric states: Secondary | ICD-10-CM | POA: Diagnosis not present

## 2023-07-26 DIAGNOSIS — F419 Anxiety disorder, unspecified: Secondary | ICD-10-CM

## 2023-07-26 MED ORDER — ESTRADIOL 0.5 MG PO TABS
0.5000 mg | ORAL_TABLET | Freq: Every day | ORAL | 3 refills | Status: DC
  Filled 2023-07-26: qty 30, 30d supply, fill #0
  Filled 2023-08-24: qty 30, 30d supply, fill #1
  Filled 2023-09-25: qty 30, 30d supply, fill #2

## 2023-07-26 MED ORDER — SIMVASTATIN 20 MG PO TABS
20.0000 mg | ORAL_TABLET | Freq: Every day | ORAL | 1 refills | Status: DC
  Filled 2023-07-26 – 2023-09-19 (×2): qty 90, 90d supply, fill #0

## 2023-07-26 MED ORDER — ALPRAZOLAM 0.5 MG PO TABS
0.5000 mg | ORAL_TABLET | Freq: Every evening | ORAL | 2 refills | Status: DC | PRN
  Filled 2023-07-26 – 2023-08-01 (×3): qty 30, 30d supply, fill #0
  Filled 2023-08-29 – 2023-09-04 (×2): qty 30, 30d supply, fill #1
  Filled 2023-10-02: qty 30, 30d supply, fill #2

## 2023-07-26 MED ORDER — METOPROLOL SUCCINATE ER 100 MG PO TB24
100.0000 mg | ORAL_TABLET | Freq: Every day | ORAL | 1 refills | Status: DC
  Filled 2023-07-26 – 2023-09-07 (×2): qty 90, 90d supply, fill #0

## 2023-07-26 MED ORDER — MEDROXYPROGESTERONE ACETATE 5 MG PO TABS
5.0000 mg | ORAL_TABLET | Freq: Every day | ORAL | 3 refills | Status: DC
  Filled 2023-07-26 – 2023-07-30 (×2): qty 30, 30d supply, fill #0
  Filled 2023-08-24: qty 30, 30d supply, fill #1
  Filled 2023-09-25: qty 30, 30d supply, fill #2

## 2023-07-26 MED ORDER — LISINOPRIL 40 MG PO TABS
40.0000 mg | ORAL_TABLET | Freq: Every day | ORAL | 1 refills | Status: DC
  Filled 2023-07-26 – 2023-09-07 (×2): qty 90, 90d supply, fill #0

## 2023-07-26 MED ORDER — AMLODIPINE BESYLATE 5 MG PO TABS
5.0000 mg | ORAL_TABLET | Freq: Every day | ORAL | 1 refills | Status: DC
  Filled 2023-07-26 – 2023-08-14 (×2): qty 90, 90d supply, fill #0

## 2023-07-26 NOTE — Progress Notes (Signed)
 [facility] [address] [place], [place] [address]  Internal MEDICINE  Office Visit Note  Patient Name: Alejandra Castro  [id number]  [id number]  Date of Service: 07/26/2023  Chief Complaint  Patient presents with   Hypertension   Hyperlipidemia   Follow-up    HPI Alejandra Castro presents for a follow-up visit for vasomotor symptoms, hypertension, high cholesterol, and anxiety.  Vasomotor symptoms -- prempro  is too expensive now. Needs alternative medication.  Hypertension -- controlled with lisinopril  and metoprolol  High cholesterol -- taking simvastatin   Anxiety and insomnia -- takes alprazolam  as needed for anxiety and to help her sleep.     Current Medication: Outpatient Encounter Medications as of 07/26/2023  Medication Sig   albuterol  (VENTOLIN  HFA) 108 (90 Base) MCG/ACT inhaler Inhale 2 puffs into the lungs every 4 (four) hours as needed for wheezing or shortness of breath.   aspirin EC 81 MG tablet Take 81 mg by mouth daily.   Calcium Carb-Cholecalciferol 600-10 MG-MCG TABS Take by mouth.   Cholecalciferol 50 MCG (2000 UT) TABS Take by mouth.   estradiol  (ESTRACE ) 0.5 MG tablet Take 1 tablet (0.5 mg total) by mouth daily.   fluticasone  (FLONASE ) 50 MCG/ACT nasal spray Place 2 sprays into both nostrils daily.   magnesium oxide (MAG-OX) 400 MG tablet Take by mouth.   meclizine (ANTIVERT) 25 MG tablet Take by mouth.   medroxyPROGESTERone  (PROVERA ) 5 MG tablet Take 1 tablet (5 mg total) by mouth daily.   Multiple Vitamin (MULTIVITAMIN WITH MINERALS) TABS tablet Take 1 tablet by mouth daily.   Omega-3 Fatty Acids (FISH OIL PO) Take 2 capsules by mouth daily.   [DISCONTINUED] ALPRAZolam  (XANAX ) 0.5 MG tablet Take 1 tablet (0.5 mg total) by mouth at bedtime as needed for anxiety or sleep.   [DISCONTINUED] amLODipine  (NORVASC ) 5 MG tablet Take 1 tablet (5 mg total) by mouth daily.   [DISCONTINUED] estrogen, conjugated,-medroxyprogesterone  (PREMPRO ) 0.3-1.5 MG tablet  Take 1 tablet by mouth daily.   [DISCONTINUED] lisinopril  (ZESTRIL ) 40 MG tablet Take 1 tablet (40 mg total) by mouth daily.   [DISCONTINUED] meloxicam  (MOBIC ) 15 MG tablet Take 1 tablet (15 mg total) by mouth daily with a meal   [DISCONTINUED] metoprolol  succinate (TOPROL -XL) 100 MG 24 hr tablet Take 1 tablet (100 mg total) by mouth daily.   [DISCONTINUED] simvastatin  (ZOCOR ) 20 MG tablet Take 1 tablet (20 mg total) by mouth daily at 6 PM.   ALPRAZolam  (XANAX ) 0.5 MG tablet Take 1 tablet (0.5 mg total) by mouth at bedtime as needed for anxiety or sleep.   amLODipine  (NORVASC ) 5 MG tablet Take 1 tablet (5 mg total) by mouth daily.   lisinopril  (ZESTRIL ) 40 MG tablet Take 1 tablet (40 mg total) by mouth daily.   metoprolol  succinate (TOPROL -XL) 100 MG 24 hr tablet Take 1 tablet (100 mg total) by mouth daily.   simvastatin  (ZOCOR ) 20 MG tablet Take 1 tablet (20 mg total) by mouth daily at 6 PM.   [DISCONTINUED] felodipine (PLENDIL) 5 MG 24 hr tablet Take 5 mg by mouth at bedtime.    [DISCONTINUED] FLUoxetine (PROZAC) 20 MG capsule Take 20-40 mg by mouth every morning. USUALLY JUST TAKES 1 BUT WILL TAKE 2 IF NEEDED    No facility-administered encounter medications on file as of 07/26/2023.    Surgical History: Past Surgical History:  Procedure Laterality Date   COLONOSCOPY     COLONOSCOPY WITH PROPOFOL  N/A 02/16/2022   Procedure: COLONOSCOPY WITH PROPOFOL ;  Surgeon: Selena Daily, MD;  Location: [facility] ENDOSCOPY;  Service: Gastroenterology;  Laterality: N/A;    Medical History: Past Medical History:  Diagnosis Date   Arthritis    Hyperlipidemia    Hypertension     Family History: Family History  Problem Relation Age of Onset   Breast cancer Neg Hx     Social History   Socioeconomic History   Marital status: Widowed    Spouse name: Not on file   Number of children: Not on file   Years of education: Not on file   Highest education level: Not on file  Occupational History    Not on file  Tobacco Use   Smoking status: Never   Smokeless tobacco: Never  Vaping Use   Vaping status: Never Used  Substance and Sexual Activity   Alcohol use: Yes    Alcohol/week: 14.0 standard drinks of alcohol    Types: 14 Cans of beer per week    Comment: rarely   Drug use: No   Sexual activity: Not Currently  Other Topics Concern   Not on file  Social History Narrative   Not on file   Social Drivers of Health   Financial Resource Strain: Low Risk  (10/28/2021)   Overall Financial Resource Strain (CARDIA)    Difficulty of Paying Living Expenses: Not hard at all  Food Insecurity: No Food Insecurity (10/28/2021)   Hunger Vital Sign    Worried About Running Out of Food in the Last Year: Never true    Ran Out of Food in the Last Year: Never true  Transportation Needs: No Transportation Needs (10/28/2021)   PRAPARE - Administrator, Civil Service (Medical): No    Lack of Transportation (Non-Medical): No  Physical Activity: Sufficiently Active (10/28/2021)   Exercise Vital Sign    Days of Exercise per Week: 4 days    Minutes of Exercise per Session: 40 min  Stress: No Stress Concern Present (10/28/2021)   [employer] of Occupational Health - Occupational Stress Questionnaire    Feeling of Stress : Not at all  Social Connections: Socially Isolated (10/28/2021)   Social Connection and Isolation Panel [NHANES]    Frequency of Communication with Friends and Family: More than three times a week    Frequency of Social Gatherings with Friends and Family: More than three times a week    Attends Religious Services: Never    Database administrator or Organizations: No    Attends Banker Meetings: Never    Marital Status: Divorced  Catering manager Violence: Not At Risk (10/28/2021)   Humiliation, Afraid, Rape, and Kick questionnaire    Fear of Current or Ex-Partner: No    Emotionally Abused: No    Physically Abused: No    Sexually Abused: No       Review of Systems  Constitutional:  Negative for chills, fatigue and unexpected weight change.  HENT:  Negative for congestion, rhinorrhea, sneezing and sore throat.   Respiratory: Negative.  Negative for cough, chest tightness, shortness of breath and wheezing.   Cardiovascular: Negative.  Negative for chest pain and palpitations.  Gastrointestinal: Negative.  Negative for abdominal pain, constipation, diarrhea, nausea and vomiting.  Genitourinary:  Negative for dysuria and frequency.  Musculoskeletal:  Negative for arthralgias, back pain, joint swelling and neck pain.  Skin:  Negative for rash.  Neurological: Negative.   Psychiatric/Behavioral:  Positive for sleep disturbance. Negative for behavioral problems (Depression), self-injury and suicidal ideas. The patient is nervous/anxious.  Vital Signs: BP 118/76   Pulse 62   Temp 98.5 F (36.9 C)   Resp 16   Ht 5\' 6"  (1.676 m)   Wt 185 lb 12.8 oz (84.3 kg)   SpO2 95%   BMI 29.99 kg/m    Physical Exam Vitals reviewed.  Constitutional:      General: She is not in acute distress.    Appearance: Normal appearance. She is obese. She is not ill-appearing.  HENT:     Head: Normocephalic and atraumatic.  Eyes:     Pupils: Pupils are equal, round, and reactive to light.  Cardiovascular:     Rate and Rhythm: Normal rate and regular rhythm.  Pulmonary:     Effort: Pulmonary effort is normal. No respiratory distress.  Neurological:     Mental Status: She is alert and oriented to person, place, and time.  Psychiatric:        Mood and Affect: Mood normal.        Behavior: Behavior normal.        Assessment/Plan: 1. Benign essential hypertension (Primary) Stable, continue lisinopril , metoprolol  and amlodipine  as prescribed.  - lisinopril  (ZESTRIL ) 40 MG tablet; Take 1 tablet (40 mg total) by mouth daily.  Dispense: 90 tablet; Refill: 1 - metoprolol  succinate (TOPROL -XL) 100 MG 24 hr tablet; Take 1 tablet (100 mg  total) by mouth daily.  Dispense: 90 tablet; Refill: 1 - amLODipine  (NORVASC ) 5 MG tablet; Take 1 tablet (5 mg total) by mouth daily.  Dispense: 90 tablet; Refill: 1  2. Mixed hyperlipidemia Continue simvastatin  as prescribed.  - simvastatin  (ZOCOR ) 20 MG tablet; Take 1 tablet (20 mg total) by mouth daily at 6 PM.  Dispense: 90 tablet; Refill: 1  3. Vasomotor symptoms due to menopause Prempro  discontinued. Start estradiol  and provera  tablet as prescribed.  - estradiol  (ESTRACE ) 0.5 MG tablet; Take 1 tablet (0.5 mg total) by mouth daily.  Dispense: 30 tablet; Refill: 3 - medroxyPROGESTERone  (PROVERA ) 5 MG tablet; Take 1 tablet (5 mg total) by mouth daily.  Dispense: 30 tablet; Refill: 3  4. Other fatigue Working on switching HRT and will see if this helps   5. Insomnia, unspecified type Continue prn alprazolam  as prescribed. Follow up in 3 months for additional refills  - ALPRAZolam  (XANAX ) 0.5 MG tablet; Take 1 tablet (0.5 mg total) by mouth at bedtime as needed for anxiety or sleep.  Dispense: 30 tablet; Refill: 2  6. Anxiety Continue prn alprazolam  as prescribed. Follow up in 3 months for additional refills  - ALPRAZolam  (XANAX ) 0.5 MG tablet; Take 1 tablet (0.5 mg total) by mouth at bedtime as needed for anxiety or sleep.  Dispense: 30 tablet; Refill: 2   General Counseling: Alejandra Castro understanding of the findings of todays visit and agrees with plan of treatment. I have discussed any further diagnostic evaluation that may be needed or ordered today. We also reviewed her medications today. she has been encouraged to call the office with any questions or concerns that should arise related to todays visit.    No orders of the defined types were placed in this encounter.   Meds ordered this encounter  Medications   ALPRAZolam  (XANAX ) 0.5 MG tablet    Sig: Take 1 tablet (0.5 mg total) by mouth at bedtime as needed for anxiety or sleep.    Dispense:  30 tablet    Refill:  2     Refills, keep on file.   estradiol  (ESTRACE ) 0.5 MG tablet  Sig: Take 1 tablet (0.5 mg total) by mouth daily.    Dispense:  30 tablet    Refill:  3    Fill new script today, please discontinue prempro    medroxyPROGESTERone  (PROVERA ) 5 MG tablet    Sig: Take 1 tablet (5 mg total) by mouth daily.    Dispense:  30 tablet    Refill:  3   lisinopril  (ZESTRIL ) 40 MG tablet    Sig: Take 1 tablet (40 mg total) by mouth daily.    Dispense:  90 tablet    Refill:  1    For next fill   metoprolol  succinate (TOPROL -XL) 100 MG 24 hr tablet    Sig: Take 1 tablet (100 mg total) by mouth daily.    Dispense:  90 tablet    Refill:  1    For next fill   simvastatin  (ZOCOR ) 20 MG tablet    Sig: Take 1 tablet (20 mg total) by mouth daily at 6 PM.    Dispense:  90 tablet    Refill:  1    For next fill   amLODipine  (NORVASC ) 5 MG tablet    Sig: Take 1 tablet (5 mg total) by mouth daily.    Dispense:  90 tablet    Refill:  1    For next fill    Return in about 12 weeks (around 10/18/2023) for F/U, anxiety med refill, Kamea Dacosta PCP.   Total time spent:30 Minutes Time spent includes review of chart, medications, test results, and follow up plan with the patient.   [place] Controlled Substance Database was reviewed by me.  This patient was seen by Laurence Pons, FNP-C in collaboration with Dr. Verneta Gone as a part of collaborative care agreement.   Kareen Jefferys R. Bobbi Burow, MSN, FNP-C Internal medicine

## 2023-07-30 ENCOUNTER — Other Ambulatory Visit: Payer: Self-pay

## 2023-07-31 ENCOUNTER — Other Ambulatory Visit: Payer: Self-pay

## 2023-08-01 ENCOUNTER — Other Ambulatory Visit: Payer: Self-pay

## 2023-08-10 NOTE — Progress Notes (Unsigned)
 [facility] [address] [place], [place] [address]  Pulmonary Sleep Medicine   Office Visit Note  Patient Name: Alejandra Castro DOB: 05-07-1955 MRN [id number]    Chief Complaint: Obstructive Sleep Apnea visit  Brief History:  Alejandra Castro is seen today for an annual follow up visit for CPAP@ 8 cmH2O. The patient has a 6 year history of sleep apnea. Patient is using PAP nightly.  The patient feels somewhat rested after sleeping with PAP.  The patient reports benefiting from PAP use. Reported sleepiness is improved and the Epworth Sleepiness Score is 2 out of 24. The patient does not take naps. The patient complains of the following: patient reports fatigue, however believed to be due to hormone deficiency.  The compliance download shows 100% compliance with an average use time of 10 hours 17 minutes. The AHI is 0.8.  The patient does not complain of limb movements disrupting sleep. The patient continues to require PAP therapy in order to eliminate sleep apnea.   ROS  General: (-) fever, (-) chills, (-) night sweat Nose and Sinuses: (-) nasal stuffiness or itchiness, (-) postnasal drip, (-) nosebleeds, (-) sinus trouble. Mouth and Throat: (-) sore throat, (-) hoarseness. Neck: (-) swollen glands, (-) enlarged thyroid , (-) neck pain. Respiratory: - cough, - shortness of breath, - wheezing. Neurologic: - numbness, - tingling. Psychiatric: + anxiety, + depression   Current Medication: Outpatient Encounter Medications as of 08/13/2023  Medication Sig   albuterol  (VENTOLIN  HFA) 108 (90 Base) MCG/ACT inhaler Inhale 2 puffs into the lungs every 4 (four) hours as needed for wheezing or shortness of breath.   ALPRAZolam  (XANAX ) 0.5 MG tablet Take 1 tablet (0.5 mg total) by mouth at bedtime as needed for anxiety or sleep.   amLODipine  (NORVASC ) 5 MG tablet Take 1 tablet (5 mg total) by mouth daily.   aspirin EC 81 MG tablet Take 81 mg by mouth daily.   Calcium Carb-Cholecalciferol  600-10 MG-MCG TABS Take by mouth.   Cholecalciferol 50 MCG (2000 UT) TABS Take by mouth.   estradiol  (ESTRACE ) 0.5 MG tablet Take 1 tablet (0.5 mg total) by mouth daily.   fluticasone  (FLONASE ) 50 MCG/ACT nasal spray Place 2 sprays into both nostrils daily.   lisinopril  (ZESTRIL ) 40 MG tablet Take 1 tablet (40 mg total) by mouth daily.   magnesium oxide (MAG-OX) 400 MG tablet Take by mouth.   meclizine (ANTIVERT) 25 MG tablet Take by mouth.   medroxyPROGESTERone  (PROVERA ) 5 MG tablet Take 1 tablet (5 mg total) by mouth daily.   metoprolol  succinate (TOPROL -XL) 100 MG 24 hr tablet Take 1 tablet (100 mg total) by mouth daily.   Multiple Vitamin (MULTIVITAMIN WITH MINERALS) TABS tablet Take 1 tablet by mouth daily.   Omega-3 Fatty Acids (FISH OIL PO) Take 2 capsules by mouth daily.   simvastatin  (ZOCOR ) 20 MG tablet Take 1 tablet (20 mg total) by mouth daily at 6 PM.   [DISCONTINUED] felodipine (PLENDIL) 5 MG 24 hr tablet Take 5 mg by mouth at bedtime.    [DISCONTINUED] FLUoxetine (PROZAC) 20 MG capsule Take 20-40 mg by mouth every morning. USUALLY JUST TAKES 1 BUT WILL TAKE 2 IF NEEDED    No facility-administered encounter medications on file as of 08/13/2023.    Surgical History: Past Surgical History:  Procedure Laterality Date   COLONOSCOPY     COLONOSCOPY WITH PROPOFOL  N/A 02/16/2022   Procedure: COLONOSCOPY WITH PROPOFOL ;  Surgeon: Selena Daily, MD;  Location: [facility] ENDOSCOPY;  Service: Gastroenterology;  Laterality: N/A;    Medical History: Past Medical History:  Diagnosis Date   Arthritis    Hyperlipidemia    Hypertension     Family History: Non contributory to the present illness  Social History: Social History   Socioeconomic History   Marital status: Widowed    Spouse name: Not on file   Number of children: Not on file   Years of education: Not on file   Highest education level: Not on file  Occupational History   Not on file  Tobacco Use   Smoking status:  Never   Smokeless tobacco: Never  Vaping Use   Vaping status: Never Used  Substance and Sexual Activity   Alcohol use: Yes    Alcohol/week: 14.0 standard drinks of alcohol    Types: 14 Cans of beer per week    Comment: rarely   Drug use: No   Sexual activity: Not Currently  Other Topics Concern   Not on file  Social History Narrative   Not on file   Social Drivers of Health   Financial Resource Strain: Low Risk  (10/28/2021)   Overall Financial Resource Strain (CARDIA)    Difficulty of Paying Living Expenses: Not hard at all  Food Insecurity: No Food Insecurity (10/28/2021)   Hunger Vital Sign    Worried About Running Out of Food in the Last Year: Never true    Ran Out of Food in the Last Year: Never true  Transportation Needs: No Transportation Needs (10/28/2021)   PRAPARE - Administrator, Civil Service (Medical): No    Lack of Transportation (Non-Medical): No  Physical Activity: Sufficiently Active (10/28/2021)   Exercise Vital Sign    Days of Exercise per Week: 4 days    Minutes of Exercise per Session: 40 min  Stress: No Stress Concern Present (10/28/2021)   [employer] of Occupational Health - Occupational Stress Questionnaire    Feeling of Stress : Not at all  Social Connections: Socially Isolated (10/28/2021)   Social Connection and Isolation Panel [NHANES]    Frequency of Communication with Friends and Family: More than three times a week    Frequency of Social Gatherings with Friends and Family: More than three times a week    Attends Religious Services: Never    Database administrator or Organizations: No    Attends Banker Meetings: Never    Marital Status: Divorced  Catering manager Violence: Not At Risk (10/28/2021)   Humiliation, Afraid, Rape, and Kick questionnaire    Fear of Current or Ex-Partner: No    Emotionally Abused: No    Physically Abused: No    Sexually Abused: No    Vital Signs: Blood pressure (!) 146/90, pulse 75,  resp. rate 16, height 5\' 6"  (1.676 m), weight 188 lb (85.3 kg), SpO2 97%. Body mass index is 30.34 kg/m.    Examination: General Appearance: The patient is well-developed, well-nourished, and in no distress. Neck Circumference: 39 cm Skin: Gross inspection of skin unremarkable. Head: normocephalic, no gross deformities. Eyes: no gross deformities noted. ENT: ears appear grossly normal Neurologic: Alert and oriented. No involuntary movements.  STOP BANG RISK ASSESSMENT S (snore) Have you been told that you snore?     NO   T (tired) Are you often tired, fatigued, or sleepy during the day?   NO  O (obstruction) Do you stop breathing, choke, or gasp during sleep? NO   P (pressure) Do you have or are you being treated  for high blood pressure? YES   B (BMI) Is your body index greater than 35 kg/m? NO   A (age) Are you 62 years old or older? YES   N (neck) Do you have a neck circumference greater than 16 inches?   NO   G (gender) Are you a female? NO   TOTAL STOP/BANG "YES" ANSWERS 2       A STOP-Bang score of 2 or less is considered low risk, and a score of 5 or more is high risk for having either moderate or severe OSA. For people who score 3 or 4, doctors may need to perform further assessment to determine how likely they are to have OSA.         EPWORTH SLEEPINESS SCALE:  Scale:  (0)= no chance of dozing; (1)= slight chance of dozing; (2)= moderate chance of dozing; (3)= high chance of dozing  Chance  Situtation    Sitting and reading: 0    Watching TV: 0    Sitting Inactive in public: 0    As a passenger in car: 2      Lying down to rest: 0    Sitting and talking: 0    Sitting quielty after lunch: 0    In a car, stopped in traffic: 0   TOTAL SCORE:   2 out of 24    SLEEP STUDIES:  Split Study (02/2017) AHI 108/hr, min SpO2 85%, CPAP@ 8 cmH2O CFlex 3.   CPAP COMPLIANCE DATA:  Date Range: 08/10/2022-08/09/2023  Average Daily Use: 10 hours 17  minutes  Median Use: 10 hours 20 minutes  Compliance for > 4 Hours: 100%  AHI: 0.8 respiratory events per hour  Days Used: 365/365 days  Mask Leak: 20.9  95th Percentile Pressure: 8         LABS: No results found for this or any previous visit (from the past 2160 hours).  Radiology: MM 3D SCREENING MAMMOGRAM BILATERAL BREAST Result Date: 08/17/2022 CLINICAL DATA:  Screening. EXAM: DIGITAL SCREENING BILATERAL MAMMOGRAM WITH TOMOSYNTHESIS AND CAD TECHNIQUE: Bilateral screening digital craniocaudal and mediolateral oblique mammograms were obtained. Bilateral screening digital breast tomosynthesis was performed. The images were evaluated with computer-aided detection. COMPARISON:  Previous exam(s). ACR Breast Density Category c: The breasts are heterogeneously dense, which may obscure small masses. FINDINGS: There are no findings suspicious for malignancy. IMPRESSION: No mammographic evidence of malignancy. A result letter of this screening mammogram will be mailed directly to the patient. RECOMMENDATION: Screening mammogram in one year. (Code:SM-B-01Y) BI-RADS CATEGORY  1: Negative. Electronically Signed   By: Clancy Crimes M.D.   On: 08/17/2022 14:46   DG Bone Density Result Date: 08/16/2022 EXAM: DUAL X-RAY ABSORPTIOMETRY (DXA) FOR BONE MINERAL DENSITY IMPRESSION: Your patient Tanyeka Mathwig completed a BMD test on 08/16/2022 using the [employer] DXA System (software version: 14.10) manufactured by [employer]. The following summarizes the results of our evaluation. Technologist: [facility], [place] PATIENT BIOGRAPHICAL: Name: Sammyjo, Mittag Patient ID: [id number] Birth Date: 1955-06-20 Height: 66.0 in. Gender: Female Exam Date: 08/16/2022 Weight: 186.8 lbs. Indications: Caucasian, Postmenopausal Fractures: Humerus Left Treatments: Calcium, Prempro  DENSITOMETRY RESULTS: Site      Region    Measured Date Measured Age WHO Classification Young Adult T-score BMD         %Change vs.  Previous Significant Change (*) AP Spine L1-L4 08/16/2020 68.5 Osteoporosis -2.5 0.889 g/cm2 DualFemur Neck Left 08/16/2022 68.5 Osteopenia -1.4 0.843 g/cm2 ASSESSMENT: The BMD measured at AP Spine L1-L4 is 0.889  g/cm2 with a T-score of -2.5. This patient is considered osteoporotic according to World Health Organization [facility]) criteria. The scan quality is good. World Science writer [facility]) criteria for post-menopausal, Caucasian Women: Normal:                   T-score at or above -1 SD Osteopenia/low bone mass: T-score between -1 and -2.5 SD Osteoporosis:             T-score at or below -2.5 SD RECOMMENDATIONS: 1. All patients should optimize calcium and vitamin D intake. 2. Consider FDA-approved medical therapies in postmenopausal women and men aged 74 years and older, based on the following: a. A hip or vertebral(clinical or morphometric) fracture b. T-score < -2.5 at the femoral neck or spine after appropriate evaluation to exclude secondary causes c. Low bone mass (T-score between -1.0 and -2.5 at the femoral neck or spine) and a 10-year probability of a hip fracture > 3% or a 10-year probability of a major osteoporosis-related fracture > 20% based on the [place] -adapted WHO algorithm 3. Clinician judgment and/or patient preferences may indicate treatment for people with 10-year fracture probabilities above or below these levels FOLLOW-UP: People with diagnosed cases of osteoporosis or at high risk for fracture should have regular bone mineral density tests. For patients eligible for Medicare, routine testing is allowed once every 2 years. The testing frequency can be increased to one year for patients who have rapidly progressing disease, those who are receiving or discontinuing medical therapy to restore bone mass, or have additional risk factors. I have reviewed this report, and agree with the above findings. [facility] Electronically Signed   By: Lorrene Rosser III M.D.   On: 08/16/2022 14:14     No results found.  No results found.    Assessment and Plan: Patient Active Problem List   Diagnosis Date Noted   Sigmoid diverticulitis 02/16/2022   Erythema of colon 02/16/2022   Abnormal cortisol level 11/30/2021   Abnormal CBC 08/24/2021   Abnormal TSH 08/24/2021   Depression 03/14/2021   Mixed hyperlipidemia 03/14/2021   Insomnia 03/14/2021   Vaginal dryness 01/04/2021   CPAP use counseling 03/15/2020   Overweight (BMI 25.0-29.9) 03/15/2020   Osteopenia 07/15/2019   Breast calcification, left 08/07/2017   Anxiety 06/27/2017   OSA on CPAP 02/23/2017   History of adenomatous polyp of colon 12/24/2014   Vertigo 12/17/2014   Benign essential hypertension 12/10/2013   Asthma without status asthmaticus 12/10/2013   Essential hypertension 12/10/2013   Vasomotor symptoms due to menopause 12/10/2013   1. OSA on CPAP (Primary) The patient does tolerate PAP and reports  benefit from PAP use. The patient was reminded how to clean equipment and advised to replace supplies routinely. The patient was also counselled on weight loss. The compliance is excellent. The AHI is 0.8.   OSA on cpap- controlled. Continue with excellent compliance with pap. CPAP continues to be medically necessary to treat this patient's OSA. F/u one year.    2. CPAP use counseling Pt reports good compliance with CPAP therapy. Cleaning machine by hand, and changing filters and tubing as directed. Denies headaches, sinus issues, palpitations, or hemoptysis.     3. Insomnia, unspecified type Reports excessive rumination. Poor sleep hygiene. Reviewed obtions for cbti with patient. She is dealing with stress related to her alcoholic daughter who has borderline personality disorder. Advised counseling. Active thinking skills reviewed along with sleep hygiene.       General Counseling: I  have discussed the findings of the evaluation and examination with Lewie Rector.  I have also discussed any further diagnostic  evaluation thatmay be needed or ordered today. Sharese verbalizes understanding of the findings of todays visit. We also reviewed her medications today and discussed drug interactions and side effects including but not limited excessive drowsiness and altered mental states. We also discussed that there is always a risk not just to her but also people around her. she has been encouraged to call the office with any questions or concerns that should arise related to todays visit.  No orders of the defined types were placed in this encounter.       I have personally obtained a history, examined the patient, evaluated laboratory and imaging results, formulated the assessment and plan and placed orders. This patient was seen today by Louann Rous, PA-C in collaboration with Dr. Cam Cava.   Cordie Deters, MD [facility] Diplomate ABMS Pulmonary Critical Care Medicine and Sleep Medicine

## 2023-08-13 ENCOUNTER — Ambulatory Visit (INDEPENDENT_AMBULATORY_CARE_PROVIDER_SITE_OTHER): Admitting: Internal Medicine

## 2023-08-13 VITALS — BP 146/90 | HR 75 | Resp 16 | Ht 66.0 in | Wt 188.0 lb

## 2023-08-13 DIAGNOSIS — G4733 Obstructive sleep apnea (adult) (pediatric): Secondary | ICD-10-CM

## 2023-08-13 DIAGNOSIS — Z7189 Other specified counseling: Secondary | ICD-10-CM

## 2023-08-13 DIAGNOSIS — G4700 Insomnia, unspecified: Secondary | ICD-10-CM

## 2023-08-13 NOTE — Patient Instructions (Signed)

## 2023-08-15 ENCOUNTER — Other Ambulatory Visit: Payer: Self-pay

## 2023-08-21 DIAGNOSIS — K080 Exfoliation of teeth due to systemic causes: Secondary | ICD-10-CM | POA: Diagnosis not present

## 2023-08-29 ENCOUNTER — Other Ambulatory Visit: Payer: Self-pay

## 2023-08-31 ENCOUNTER — Encounter: Payer: Self-pay | Admitting: Nurse Practitioner

## 2023-09-04 ENCOUNTER — Other Ambulatory Visit: Payer: Self-pay

## 2023-09-07 ENCOUNTER — Other Ambulatory Visit: Payer: Self-pay

## 2023-09-20 ENCOUNTER — Other Ambulatory Visit: Payer: Self-pay

## 2023-09-23 DIAGNOSIS — G4733 Obstructive sleep apnea (adult) (pediatric): Secondary | ICD-10-CM | POA: Diagnosis not present

## 2023-10-03 ENCOUNTER — Other Ambulatory Visit: Payer: Self-pay

## 2023-10-18 ENCOUNTER — Ambulatory Visit (INDEPENDENT_AMBULATORY_CARE_PROVIDER_SITE_OTHER): Admitting: Nurse Practitioner

## 2023-10-18 ENCOUNTER — Other Ambulatory Visit: Payer: Self-pay

## 2023-10-18 ENCOUNTER — Encounter: Payer: Self-pay | Admitting: Nurse Practitioner

## 2023-10-18 VITALS — BP 135/70 | HR 73 | Temp 98.7°F | Resp 16 | Ht 66.0 in | Wt 184.4 lb

## 2023-10-18 DIAGNOSIS — N951 Menopausal and female climacteric states: Secondary | ICD-10-CM

## 2023-10-18 DIAGNOSIS — R5383 Other fatigue: Secondary | ICD-10-CM | POA: Diagnosis not present

## 2023-10-18 DIAGNOSIS — Z23 Encounter for immunization: Secondary | ICD-10-CM

## 2023-10-18 DIAGNOSIS — I10 Essential (primary) hypertension: Secondary | ICD-10-CM | POA: Diagnosis not present

## 2023-10-18 DIAGNOSIS — F419 Anxiety disorder, unspecified: Secondary | ICD-10-CM

## 2023-10-18 DIAGNOSIS — E782 Mixed hyperlipidemia: Secondary | ICD-10-CM | POA: Diagnosis not present

## 2023-10-18 DIAGNOSIS — E559 Vitamin D deficiency, unspecified: Secondary | ICD-10-CM

## 2023-10-18 DIAGNOSIS — Z1231 Encounter for screening mammogram for malignant neoplasm of breast: Secondary | ICD-10-CM

## 2023-10-18 DIAGNOSIS — E538 Deficiency of other specified B group vitamins: Secondary | ICD-10-CM

## 2023-10-18 MED ORDER — AMLODIPINE BESYLATE 5 MG PO TABS
5.0000 mg | ORAL_TABLET | Freq: Every day | ORAL | 1 refills | Status: DC
  Filled 2023-10-18 – 2023-11-14 (×2): qty 90, 90d supply, fill #0
  Filled 2024-02-14: qty 90, 90d supply, fill #1

## 2023-10-18 MED ORDER — MEDROXYPROGESTERONE ACETATE 5 MG PO TABS
5.0000 mg | ORAL_TABLET | Freq: Every day | ORAL | 1 refills | Status: DC
  Filled 2023-10-18 – 2023-10-26 (×2): qty 90, 90d supply, fill #0
  Filled 2024-01-20: qty 90, 90d supply, fill #1

## 2023-10-18 MED ORDER — SIMVASTATIN 20 MG PO TABS
20.0000 mg | ORAL_TABLET | Freq: Every day | ORAL | 1 refills | Status: AC
  Filled 2023-10-18 – 2023-12-17 (×2): qty 90, 90d supply, fill #0
  Filled 2024-03-18: qty 90, 90d supply, fill #1

## 2023-10-18 MED ORDER — ALPRAZOLAM 0.5 MG PO TABS
0.5000 mg | ORAL_TABLET | Freq: Every evening | ORAL | 2 refills | Status: DC | PRN
  Filled 2023-10-18 – 2023-10-31 (×2): qty 30, 30d supply, fill #0
  Filled 2023-11-28: qty 30, 30d supply, fill #1
  Filled 2023-12-29: qty 30, 30d supply, fill #2

## 2023-10-18 MED ORDER — PNEUMOCOCCAL 20-VAL CONJ VACC 0.5 ML IM SUSY
0.5000 mL | PREFILLED_SYRINGE | Freq: Once | INTRAMUSCULAR | 0 refills | Status: AC | PRN
  Filled 2023-10-18: qty 0.5, fill #0

## 2023-10-18 MED ORDER — ESTRADIOL 0.5 MG PO TABS
0.5000 mg | ORAL_TABLET | Freq: Every day | ORAL | 1 refills | Status: DC
  Filled 2023-10-18: qty 90, 90d supply, fill #0
  Filled 2024-01-20: qty 90, 90d supply, fill #1

## 2023-10-18 MED ORDER — LISINOPRIL 40 MG PO TABS
40.0000 mg | ORAL_TABLET | Freq: Every day | ORAL | 1 refills | Status: AC
  Filled 2023-10-18 – 2023-12-09 (×2): qty 90, 90d supply, fill #0
  Filled 2024-03-05: qty 90, 90d supply, fill #1

## 2023-10-18 MED ORDER — METOPROLOL SUCCINATE ER 100 MG PO TB24
100.0000 mg | ORAL_TABLET | Freq: Every day | ORAL | 1 refills | Status: AC
  Filled 2023-10-18 – 2023-12-09 (×2): qty 90, 90d supply, fill #0
  Filled 2024-03-05: qty 90, 90d supply, fill #1

## 2023-10-18 NOTE — Progress Notes (Signed)
 [facility] [address] [place], [place] [address]  Internal MEDICINE  Office Visit Note  Patient Name: Alejandra Castro  [id number]  [id number]  Date of Service: 10/18/2023  Chief Complaint  Patient presents with   Hyperlipidemia   Hypertension   Follow-up    HPI Alejandra Castro presents for a follow-up visit for anxiety, vasomotor symptoms, hypertension and high cholesterol.  Anxiety -- takes alprazolam  as needed, due for refills.  Vasomotor symptoms of menopause -- switch to estradiol  tab and medroxyprogesterone  tab since prempro  is too expensive with her insurance and she is doing well with this change. Hypertension -- controlled with amlodipine , lisinopril  and metoprolol .  High cholesterol -- taking simvastatin , no issues.     Current Medication: Outpatient Encounter Medications as of 10/18/2023  Medication Sig   pneumococcal 20-valent conjugate vaccine (PREVNAR 20 ) 0.5 ML injection Inject 0.5 mLs into the muscle once as needed for up to 1 dose for immunization.   albuterol  (VENTOLIN  HFA) 108 (90 Base) MCG/ACT inhaler Inhale 2 puffs into the lungs every 4 (four) hours as needed for wheezing or shortness of breath.   ALPRAZolam  (XANAX ) 0.5 MG tablet Take 1 tablet (0.5 mg total) by mouth at bedtime as needed for anxiety or sleep.   amLODipine  (NORVASC ) 5 MG tablet Take 1 tablet (5 mg total) by mouth daily.   aspirin EC 81 MG tablet Take 81 mg by mouth daily.   Calcium Carb-Cholecalciferol 600-10 MG-MCG TABS Take by mouth.   Cholecalciferol 50 MCG (2000 UT) TABS Take by mouth.   estradiol  (ESTRACE ) 0.5 MG tablet Take 1 tablet (0.5 mg total) by mouth daily.   fluticasone  (FLONASE ) 50 MCG/ACT nasal spray Place 2 sprays into both nostrils daily.   lisinopril  (ZESTRIL ) 40 MG tablet Take 1 tablet (40 mg total) by mouth daily.   magnesium oxide (MAG-OX) 400 MG tablet Take by mouth.   meclizine (ANTIVERT) 25 MG tablet Take by mouth.   medroxyPROGESTERone  (PROVERA ) 5 MG tablet  Take 1 tablet (5 mg total) by mouth daily.   metoprolol  succinate (TOPROL -XL) 100 MG 24 hr tablet Take 1 tablet (100 mg total) by mouth daily.   Multiple Vitamin (MULTIVITAMIN WITH MINERALS) TABS tablet Take 1 tablet by mouth daily.   Omega-3 Fatty Acids (FISH OIL PO) Take 2 capsules by mouth daily.   simvastatin  (ZOCOR ) 20 MG tablet Take 1 tablet (20 mg total) by mouth daily at 6 PM.   [DISCONTINUED] ALPRAZolam  (XANAX ) 0.5 MG tablet Take 1 tablet (0.5 mg total) by mouth at bedtime as needed for anxiety or sleep.   [DISCONTINUED] amLODipine  (NORVASC ) 5 MG tablet Take 1 tablet (5 mg total) by mouth daily.   [DISCONTINUED] estradiol  (ESTRACE ) 0.5 MG tablet Take 1 tablet (0.5 mg total) by mouth daily.   [DISCONTINUED] felodipine (PLENDIL) 5 MG 24 hr tablet Take 5 mg by mouth at bedtime.    [DISCONTINUED] FLUoxetine (PROZAC) 20 MG capsule Take 20-40 mg by mouth every morning. USUALLY JUST TAKES 1 BUT WILL TAKE 2 IF NEEDED    [DISCONTINUED] lisinopril  (ZESTRIL ) 40 MG tablet Take 1 tablet (40 mg total) by mouth daily.   [DISCONTINUED] medroxyPROGESTERone  (PROVERA ) 5 MG tablet Take 1 tablet (5 mg total) by mouth daily.   [DISCONTINUED] metoprolol  succinate (TOPROL -XL) 100 MG 24 hr tablet Take 1 tablet (100 mg total) by mouth daily.   [DISCONTINUED] simvastatin  (ZOCOR ) 20 MG tablet Take 1 tablet (20 mg total) by mouth daily at 6 PM.   No facility-administered encounter medications on file  as of 10/18/2023.    Surgical History: Past Surgical History:  Procedure Laterality Date   COLONOSCOPY     COLONOSCOPY WITH PROPOFOL  N/A 02/16/2022   Procedure: COLONOSCOPY WITH PROPOFOL ;  Surgeon: Unk Corinn Skiff, MD;  Location: [facility] ENDOSCOPY;  Service: Gastroenterology;  Laterality: N/A;    Medical History: Past Medical History:  Diagnosis Date   Arthritis    Hyperlipidemia    Hypertension     Family History: Family History  Problem Relation Age of Onset   Breast cancer Neg Hx     Social History    Socioeconomic History   Marital status: Widowed    Spouse name: Not on file   Number of children: Not on file   Years of education: Not on file   Highest education level: Not on file  Occupational History   Not on file  Tobacco Use   Smoking status: Never   Smokeless tobacco: Never  Vaping Use   Vaping status: Never Used  Substance and Sexual Activity   Alcohol use: Yes    Alcohol/week: 14.0 standard drinks of alcohol    Types: 14 Cans of beer per week    Comment: rarely   Drug use: No   Sexual activity: Not Currently  Other Topics Concern   Not on file  Social History Narrative   Not on file   Social Drivers of Health   Financial Resource Strain: Low Risk  (10/28/2021)   Overall Financial Resource Strain (CARDIA)    Difficulty of Paying Living Expenses: Not hard at all  Food Insecurity: No Food Insecurity (10/28/2021)   Hunger Vital Sign    Worried About Running Out of Food in the Last Year: Never true    Ran Out of Food in the Last Year: Never true  Transportation Needs: No Transportation Needs (10/28/2021)   PRAPARE - Administrator, Civil Service (Medical): No    Lack of Transportation (Non-Medical): No  Physical Activity: Sufficiently Active (10/28/2021)   Exercise Vital Sign    Days of Exercise per Week: 4 days    Minutes of Exercise per Session: 40 min  Stress: No Stress Concern Present (10/28/2021)   [employer] of Occupational Health - Occupational Stress Questionnaire    Feeling of Stress : Not at all  Social Connections: Socially Isolated (10/28/2021)   Social Connection and Isolation Panel    Frequency of Communication with Friends and Family: More than three times a week    Frequency of Social Gatherings with Friends and Family: More than three times a week    Attends Religious Services: Never    Database administrator or Organizations: No    Attends Banker Meetings: Never    Marital Status: Divorced  Catering manager  Violence: Not At Risk (10/28/2021)   Humiliation, Afraid, Rape, and Kick questionnaire    Fear of Current or Ex-Partner: No    Emotionally Abused: No    Physically Abused: No    Sexually Abused: No      Review of Systems  Constitutional:  Negative for chills, fatigue and unexpected weight change.  HENT:  Negative for congestion, rhinorrhea, sneezing and sore throat.   Respiratory: Negative.  Negative for cough, chest tightness, shortness of breath and wheezing.   Cardiovascular: Negative.  Negative for chest pain and palpitations.  Gastrointestinal: Negative.  Negative for abdominal pain, constipation, diarrhea, nausea and vomiting.  Genitourinary:  Negative for dysuria and frequency.  Musculoskeletal:  Negative for arthralgias, back pain,  joint swelling and neck pain.  Skin:  Negative for rash.  Neurological: Negative.   Psychiatric/Behavioral:  Positive for sleep disturbance. Negative for behavioral problems (Depression), self-injury and suicidal ideas. The patient is nervous/anxious.     Vital Signs: BP 135/70   Pulse 73   Temp 98.7 F (37.1 C)   Resp 16   Ht 5' 6 (1.676 m)   Wt 184 lb 6.4 oz (83.6 kg)   SpO2 96%   BMI 29.76 kg/m    Physical Exam Vitals reviewed.  Constitutional:      General: She is not in acute distress.    Appearance: Normal appearance. She is obese. She is not ill-appearing.  HENT:     Head: Normocephalic and atraumatic.  Eyes:     Pupils: Pupils are equal, round, and reactive to light.  Cardiovascular:     Rate and Rhythm: Normal rate and regular rhythm.  Pulmonary:     Effort: Pulmonary effort is normal. No respiratory distress.  Neurological:     Mental Status: She is alert and oriented to person, place, and time.  Psychiatric:        Mood and Affect: Mood normal.        Behavior: Behavior normal.        Assessment/Plan: 1. Benign essential hypertension (Primary) Stable, continue lisinopril , metoprolol  and amlodipine  as  prescribed. Routine labs ordered  - lisinopril  (ZESTRIL ) 40 MG tablet; Take 1 tablet (40 mg total) by mouth daily.  Dispense: 90 tablet; Refill: 1 - amLODipine  (NORVASC ) 5 MG tablet; Take 1 tablet (5 mg total) by mouth daily.  Dispense: 90 tablet; Refill: 1 - metoprolol  succinate (TOPROL -XL) 100 MG 24 hr tablet; Take 1 tablet (100 mg total) by mouth daily.  Dispense: 90 tablet; Refill: 1 - CBC with Differential/Platelet - CMP14+EGFR - Lipid Profile  2. Mixed hyperlipidemia Continue simvastatin  as prescribed. routine labs ordered  - simvastatin  (ZOCOR ) 20 MG tablet; Take 1 tablet (20 mg total) by mouth daily at 6 PM.  Dispense: 90 tablet; Refill: 1 - CBC with Differential/Platelet - CMP14+EGFR - Lipid Profile  3. Vasomotor symptoms due to menopause Continue estradiol  and medroxyprogesterone  tablets as prescribed.  - estradiol  (ESTRACE ) 0.5 MG tablet; Take 1 tablet (0.5 mg total) by mouth daily.  Dispense: 90 tablet; Refill: 1 - medroxyPROGESTERone  (PROVERA ) 5 MG tablet; Take 1 tablet (5 mg total) by mouth daily.  Dispense: 90 tablet; Refill: 1  4. Other fatigue Routine labs ordered  - CBC with Differential/Platelet - CMP14+EGFR - Lipid Profile - Vitamin D (25 hydroxy) - B12 and Folate Panel  5. B12 deficiency Routine labs ordered  - CBC with Differential/Platelet - B12 and Folate Panel  6. Vitamin D deficiency Routine lab ordered  - Vitamin D (25 hydroxy)  7. Encounter for screening mammogram for malignant neoplasm of breast Routine mammogram ordered  - MM 3D SCREENING MAMMOGRAM BILATERAL BREAST; Future  8. Need for vaccination - pneumococcal 20-valent conjugate vaccine (PREVNAR 20 ) 0.5 ML injection; Inject 0.5 mLs into the muscle once as needed for up to 1 dose for immunization.  Dispense: 0.5 mL; Refill: 0  9. Anxiety Continue prn alprazolam  as prescribed. Follow up in 3 months for additional refills.  - ALPRAZolam  (XANAX ) 0.5 MG tablet; Take 1 tablet (0.5 mg total) by  mouth at bedtime as needed for anxiety or sleep.  Dispense: 30 tablet; Refill: 2   General Counseling: Alejandra Castro understanding of the findings of todays visit and agrees with plan of treatment. I have  discussed any further diagnostic evaluation that may be needed or ordered today. We also reviewed her medications today. she has been encouraged to call the office with any questions or concerns that should arise related to todays visit.    Orders Placed This Encounter  Procedures   MM 3D SCREENING MAMMOGRAM BILATERAL BREAST   CBC with Differential/Platelet   CMP14+EGFR   Lipid Profile   Vitamin D (25 hydroxy)   B12 and Folate Panel    Meds ordered this encounter  Medications   ALPRAZolam  (XANAX ) 0.5 MG tablet    Sig: Take 1 tablet (0.5 mg total) by mouth at bedtime as needed for anxiety or sleep.    Dispense:  30 tablet    Refill:  2    Refills, keep on file.   estradiol  (ESTRACE ) 0.5 MG tablet    Sig: Take 1 tablet (0.5 mg total) by mouth daily.    Dispense:  90 tablet    Refill:  1    Fill new script today, please discontinue prempro    medroxyPROGESTERone  (PROVERA ) 5 MG tablet    Sig: Take 1 tablet (5 mg total) by mouth daily.    Dispense:  90 tablet    Refill:  1   lisinopril  (ZESTRIL ) 40 MG tablet    Sig: Take 1 tablet (40 mg total) by mouth daily.    Dispense:  90 tablet    Refill:  1    For next fill   amLODipine  (NORVASC ) 5 MG tablet    Sig: Take 1 tablet (5 mg total) by mouth daily.    Dispense:  90 tablet    Refill:  1    For next fill   simvastatin  (ZOCOR ) 20 MG tablet    Sig: Take 1 tablet (20 mg total) by mouth daily at 6 PM.    Dispense:  90 tablet    Refill:  1    For next fill   metoprolol  succinate (TOPROL -XL) 100 MG 24 hr tablet    Sig: Take 1 tablet (100 mg total) by mouth daily.    Dispense:  90 tablet    Refill:  1    For next fill   pneumococcal 20-valent conjugate vaccine (PREVNAR 20 ) 0.5 ML injection    Sig: Inject 0.5 mLs into the  muscle once as needed for up to 1 dose for immunization.    Dispense:  0.5 mL    Refill:  0    Due for prevnar 20     Return for previously scheduled, AWV, Alejandra Castro PCP.   Total time spent:30 Minutes Time spent includes review of chart, medications, test results, and follow up plan with the patient.   [place] Controlled Substance Database was reviewed by me.  This patient was seen by Mardy Maxin, FNP-C in collaboration with Dr. Sigrid Bathe as a part of collaborative care agreement.   Arshad Oberholzer R. Maxin, MSN, FNP-C Internal medicine

## 2023-10-21 ENCOUNTER — Encounter: Payer: Self-pay | Admitting: Nurse Practitioner

## 2023-10-25 DIAGNOSIS — G4733 Obstructive sleep apnea (adult) (pediatric): Secondary | ICD-10-CM | POA: Diagnosis not present

## 2023-10-26 ENCOUNTER — Encounter: Payer: Self-pay | Admitting: Nurse Practitioner

## 2023-10-26 ENCOUNTER — Other Ambulatory Visit: Payer: Self-pay

## 2023-10-31 ENCOUNTER — Other Ambulatory Visit: Payer: Self-pay

## 2023-11-14 ENCOUNTER — Other Ambulatory Visit: Payer: Self-pay

## 2023-11-28 ENCOUNTER — Other Ambulatory Visit: Payer: Self-pay

## 2023-12-10 ENCOUNTER — Other Ambulatory Visit: Payer: Self-pay

## 2023-12-17 ENCOUNTER — Other Ambulatory Visit: Payer: Self-pay

## 2023-12-24 DIAGNOSIS — Z86018 Personal history of other benign neoplasm: Secondary | ICD-10-CM | POA: Diagnosis not present

## 2023-12-24 DIAGNOSIS — L578 Other skin changes due to chronic exposure to nonionizing radiation: Secondary | ICD-10-CM | POA: Diagnosis not present

## 2023-12-31 ENCOUNTER — Other Ambulatory Visit: Payer: Self-pay

## 2024-01-28 ENCOUNTER — Other Ambulatory Visit: Payer: Self-pay | Admitting: Nurse Practitioner

## 2024-01-28 DIAGNOSIS — F419 Anxiety disorder, unspecified: Secondary | ICD-10-CM

## 2024-01-30 ENCOUNTER — Other Ambulatory Visit: Payer: Self-pay

## 2024-01-30 DIAGNOSIS — N951 Menopausal and female climacteric states: Secondary | ICD-10-CM | POA: Diagnosis not present

## 2024-01-30 DIAGNOSIS — G4700 Insomnia, unspecified: Secondary | ICD-10-CM | POA: Diagnosis not present

## 2024-01-30 DIAGNOSIS — I10 Essential (primary) hypertension: Secondary | ICD-10-CM | POA: Diagnosis not present

## 2024-01-30 DIAGNOSIS — F419 Anxiety disorder, unspecified: Secondary | ICD-10-CM | POA: Diagnosis not present

## 2024-01-30 DIAGNOSIS — E782 Mixed hyperlipidemia: Secondary | ICD-10-CM | POA: Diagnosis not present

## 2024-01-30 DIAGNOSIS — E559 Vitamin D deficiency, unspecified: Secondary | ICD-10-CM | POA: Diagnosis not present

## 2024-01-30 MED ORDER — ALPRAZOLAM 0.5 MG PO TABS
0.5000 mg | ORAL_TABLET | Freq: Every evening | ORAL | 0 refills | Status: DC | PRN
Start: 2024-01-30 — End: 2024-02-07
  Filled 2024-01-30: qty 30, 30d supply, fill #0

## 2024-01-31 LAB — CBC WITH DIFFERENTIAL/PLATELET
Basophils Absolute: 0 x10E3/uL (ref 0.0–0.2)
Basos: 1 %
EOS (ABSOLUTE): 0.1 x10E3/uL (ref 0.0–0.4)
Eos: 2 %
Hematocrit: 41.1 % (ref 34.0–46.6)
Hemoglobin: 13.4 g/dL (ref 11.1–15.9)
Immature Grans (Abs): 0 x10E3/uL (ref 0.0–0.1)
Immature Granulocytes: 0 %
Lymphocytes Absolute: 1.8 x10E3/uL (ref 0.7–3.1)
Lymphs: 33 %
MCH: 31 pg (ref 26.6–33.0)
MCHC: 32.6 g/dL (ref 31.5–35.7)
MCV: 95 fL (ref 79–97)
Monocytes Absolute: 0.5 x10E3/uL (ref 0.1–0.9)
Monocytes: 9 %
Neutrophils Absolute: 3 x10E3/uL (ref 1.4–7.0)
Neutrophils: 55 %
Platelets: 291 x10E3/uL (ref 150–450)
RBC: 4.32 x10E6/uL (ref 3.77–5.28)
RDW: 12 % (ref 11.7–15.4)
WBC: 5.3 x10E3/uL (ref 3.4–10.8)

## 2024-01-31 LAB — CMP14+EGFR
ALT: 15 IU/L (ref 0–32)
AST: 18 IU/L (ref 0–40)
Albumin: 4.3 g/dL (ref 3.9–4.9)
Alkaline Phosphatase: 67 IU/L (ref 49–135)
BUN/Creatinine Ratio: 21 (ref 12–28)
BUN: 16 mg/dL (ref 8–27)
Bilirubin Total: 0.4 mg/dL (ref 0.0–1.2)
CO2: 23 mmol/L (ref 20–29)
Calcium: 9.2 mg/dL (ref 8.7–10.3)
Chloride: 100 mmol/L (ref 96–106)
Creatinine, Ser: 0.76 mg/dL (ref 0.57–1.00)
Globulin, Total: 2.3 g/dL (ref 1.5–4.5)
Glucose: 95 mg/dL (ref 70–99)
Potassium: 4.5 mmol/L (ref 3.5–5.2)
Sodium: 134 mmol/L (ref 134–144)
Total Protein: 6.6 g/dL (ref 6.0–8.5)
eGFR: 84 mL/min/1.73 (ref >59)

## 2024-01-31 LAB — B12 AND FOLATE PANEL
Folate: 9 ng/mL (ref >3.0)
Vitamin B-12: 605 pg/mL (ref 232–1245)

## 2024-01-31 LAB — LIPID PANEL
Chol/HDL Ratio: 2.4 ratio (ref 0.0–4.4)
Cholesterol, Total: 149 mg/dL (ref 100–199)
HDL: 61 mg/dL (ref >39)
LDL Chol Calc (NIH): 76 mg/dL (ref 0–99)
Triglycerides: 61 mg/dL (ref 0–149)
VLDL Cholesterol Cal: 12 mg/dL (ref 5–40)

## 2024-01-31 LAB — VITAMIN D 25 HYDROXY (VIT D DEFICIENCY, FRACTURES): Vit D, 25-Hydroxy: 34.8 ng/mL (ref 30.0–100.0)

## 2024-02-07 ENCOUNTER — Encounter: Payer: Self-pay | Admitting: Nurse Practitioner

## 2024-02-07 ENCOUNTER — Ambulatory Visit: Payer: Medicare Other | Admitting: Nurse Practitioner

## 2024-02-07 ENCOUNTER — Other Ambulatory Visit: Payer: Self-pay

## 2024-02-07 VITALS — BP 134/80 | HR 64 | Temp 98.2°F | Resp 16 | Ht 66.0 in | Wt 188.6 lb

## 2024-02-07 DIAGNOSIS — Z0001 Encounter for general adult medical examination with abnormal findings: Secondary | ICD-10-CM | POA: Diagnosis not present

## 2024-02-07 DIAGNOSIS — I10 Essential (primary) hypertension: Secondary | ICD-10-CM | POA: Diagnosis not present

## 2024-02-07 DIAGNOSIS — E782 Mixed hyperlipidemia: Secondary | ICD-10-CM

## 2024-02-07 DIAGNOSIS — N951 Menopausal and female climacteric states: Secondary | ICD-10-CM

## 2024-02-07 DIAGNOSIS — F419 Anxiety disorder, unspecified: Secondary | ICD-10-CM | POA: Diagnosis not present

## 2024-02-07 MED ORDER — ALPRAZOLAM 0.5 MG PO TABS
0.5000 mg | ORAL_TABLET | Freq: Every evening | ORAL | 1 refills | Status: DC | PRN
  Filled 2024-02-07 – 2024-02-29 (×2): qty 30, 30d supply, fill #0
  Filled 2024-03-26 – 2024-03-28 (×2): qty 30, 30d supply, fill #1

## 2024-02-07 MED ORDER — ESTRADIOL 0.5 MG PO TABS
0.5000 mg | ORAL_TABLET | Freq: Every day | ORAL | 1 refills | Status: AC
  Filled 2024-02-07 – 2024-04-20 (×2): qty 90, 90d supply, fill #0

## 2024-02-07 NOTE — Progress Notes (Signed)
 [facility] [address] [place], [place] [address]  Internal MEDICINE  Office Visit Note  Patient Name: Alejandra Castro  [id number]  [id number]  Date of Service: 02/07/2024  Chief Complaint  Patient presents with   Hyperlipidemia   Hypertension   Medicare Wellness    HPI Debar presents for a medicare annual wellness visit.  Well-appearing 68 y.o. female with hypertension, OSA, asthma, osteopenia, high cholesterol, anxiety, and depression. And vasomotor symptoms of menopause.  Routine CRC screening: due in 2033 Routine mammogram: due now, has order, will schedule.  DEXA scan: done last year  Pap smear: discontinued/aged out.  Labs: done this month, all labs are normal.  New or worsening pain: none Other concerns: none      02/07/2024   10:11 AM 02/05/2023   10:26 AM 10/28/2021    2:28 PM  MMSE - Mini Mental State Exam  Not completed:   Unable to complete  Orientation to time 5 5   Orientation to Place 5 5   Registration 3 3   Attention/ Calculation 5 5   Recall 3 3   Language- name 2 objects 2 2   Language- repeat 1 1   Language- follow 3 step command 3 3   Language- read & follow direction 1 1   Write a sentence 1 1   Copy design 1 1   Total score 30 30     Functional Status Survey: Is the patient deaf or have difficulty hearing?: No Does the patient have difficulty seeing, even when wearing glasses/contacts?: No Does the patient have difficulty concentrating, remembering, or making decisions?: No Does the patient have difficulty walking or climbing stairs?: No Does the patient have difficulty dressing or bathing?: No Does the patient have difficulty doing errands alone such as visiting a doctor's office or shopping?: No     12/02/2021   10:49 AM 02/16/2022    9:55 AM 06/20/2022    3:23 PM 02/05/2023   10:25 AM 02/07/2024   10:10 AM  Fall Risk  Falls in the past year? 0  0 0 0  Was there an injury with Fall? 0  0 0 0  Fall Risk Category  Calculator 0  0 0 0  Fall Risk Category (Retired) Low       (RETIRED) Patient Fall Risk Level Low fall risk  Low fall risk      Patient at Risk for Falls Due to No Fall Risks  No Fall Risks No Fall Risks No Fall Risks  Fall risk Follow up Falls evaluation completed   Falls evaluation completed Falls evaluation completed Falls evaluation completed     Data saved with a previous flowsheet row definition       02/07/2024   10:10 AM  Depression screen PHQ 2/9  Decreased Interest 0  Down, Depressed, Hopeless 0  PHQ - 2 Score 0       12/23/2021   10:52 AM  GAD 7 : Generalized Anxiety Score  Nervous, Anxious, on Edge 3  Control/stop worrying 3  Worry too much - different things 1  Trouble relaxing 1  Restless 2  Easily annoyed or irritable 2  Afraid - awful might happen 0  Total GAD 7 Score 12  Anxiety Difficulty Not difficult at all      Current Medication: Outpatient Encounter Medications as of 02/07/2024  Medication Sig   albuterol  (VENTOLIN  HFA) 108 (90 Base) MCG/ACT inhaler Inhale 2 puffs into the lungs every 4 (four) hours as  needed for wheezing or shortness of breath.   ALPRAZolam  (XANAX ) 0.5 MG tablet Take 1 tablet (0.5 mg total) by mouth at bedtime as needed for anxiety or sleep.   amLODipine  (NORVASC ) 5 MG tablet Take 1 tablet (5 mg total) by mouth daily.   aspirin EC 81 MG tablet Take 81 mg by mouth daily.   Calcium Carb-Cholecalciferol 600-10 MG-MCG TABS Take by mouth.   Cholecalciferol 50 MCG (2000 UT) TABS Take by mouth.   estradiol  (ESTRACE ) 0.5 MG tablet Take 1 tablet (0.5 mg total) by mouth daily.   fluticasone  (FLONASE ) 50 MCG/ACT nasal spray Place 2 sprays into both nostrils daily.   lisinopril  (ZESTRIL ) 40 MG tablet Take 1 tablet (40 mg total) by mouth daily.   magnesium oxide (MAG-OX) 400 MG tablet Take by mouth.   meclizine (ANTIVERT) 25 MG tablet Take by mouth.   medroxyPROGESTERone  (PROVERA ) 5 MG tablet Take 1 tablet (5 mg total) by mouth daily.    metoprolol  succinate (TOPROL -XL) 100 MG 24 hr tablet Take 1 tablet (100 mg total) by mouth daily.   Multiple Vitamin (MULTIVITAMIN WITH MINERALS) TABS tablet Take 1 tablet by mouth daily.   Omega-3 Fatty Acids (FISH OIL PO) Take 2 capsules by mouth daily.   pneumococcal 20-valent conjugate vaccine (PREVNAR 20 ) 0.5 ML injection Inject 0.5 mLs into the muscle once as needed for up to 1 dose for immunization.   simvastatin  (ZOCOR ) 20 MG tablet Take 1 tablet (20 mg total) by mouth daily at 6 PM.   [DISCONTINUED] ALPRAZolam  (XANAX ) 0.5 MG tablet Take 1 tablet (0.5 mg total) by mouth at bedtime as needed for anxiety or sleep.   [DISCONTINUED] estradiol  (ESTRACE ) 0.5 MG tablet Take 1 tablet (0.5 mg total) by mouth daily.   [DISCONTINUED] felodipine (PLENDIL) 5 MG 24 hr tablet Take 5 mg by mouth at bedtime.    [DISCONTINUED] FLUoxetine (PROZAC) 20 MG capsule Take 20-40 mg by mouth every morning. USUALLY JUST TAKES 1 BUT WILL TAKE 2 IF NEEDED    No facility-administered encounter medications on file as of 02/07/2024.    Surgical History: Past Surgical History:  Procedure Laterality Date   COLONOSCOPY     COLONOSCOPY WITH PROPOFOL  N/A 02/16/2022   Procedure: COLONOSCOPY WITH PROPOFOL ;  Surgeon: Unk Corinn Skiff, MD;  Location: [facility] ENDOSCOPY;  Service: Gastroenterology;  Laterality: N/A;    Medical History: Past Medical History:  Diagnosis Date   Arthritis    Hyperlipidemia    Hypertension     Family History: Family History  Problem Relation Age of Onset   Breast cancer Neg Hx     Social History   Socioeconomic History   Marital status: Widowed    Spouse name: Not on file   Number of children: Not on file   Years of education: Not on file   Highest education level: Not on file  Occupational History   Not on file  Tobacco Use   Smoking status: Never   Smokeless tobacco: Never  Vaping Use   Vaping status: Never Used  Substance and Sexual Activity   Alcohol use: Yes     Alcohol/week: 14.0 standard drinks of alcohol    Types: 14 Cans of beer per week    Comment: rarely   Drug use: No   Sexual activity: Not Currently  Other Topics Concern   Not on file  Social History Narrative   Not on file   Social Drivers of Health   Financial Resource Strain: Low Risk  (10/28/2021)  Overall Financial Resource Strain (CARDIA)    Difficulty of Paying Living Expenses: Not hard at all  Food Insecurity: No Food Insecurity (10/28/2021)   Hunger Vital Sign    Worried About Running Out of Food in the Last Year: Never true    Ran Out of Food in the Last Year: Never true  Transportation Needs: No Transportation Needs (10/28/2021)   PRAPARE - Administrator, Civil Service (Medical): No    Lack of Transportation (Non-Medical): No  Physical Activity: Sufficiently Active (10/28/2021)   Exercise Vital Sign    Days of Exercise per Week: 4 days    Minutes of Exercise per Session: 40 min  Stress: No Stress Concern Present (10/28/2021)   [employer] of Occupational Health - Occupational Stress Questionnaire    Feeling of Stress : Not at all  Social Connections: Socially Isolated (10/28/2021)   Social Connection and Isolation Panel    Frequency of Communication with Friends and Family: More than three times a week    Frequency of Social Gatherings with Friends and Family: More than three times a week    Attends Religious Services: Never    Database Administrator or Organizations: No    Attends Banker Meetings: Never    Marital Status: Divorced  Catering Manager Violence: Not At Risk (10/28/2021)   Humiliation, Afraid, Rape, and Kick questionnaire    Fear of Current or Ex-Partner: No    Emotionally Abused: No    Physically Abused: No    Sexually Abused: No      Review of Systems  Constitutional:  Negative for activity change, appetite change, chills, fatigue, fever and unexpected weight change.  HENT: Negative.  Negative for congestion, ear  pain, rhinorrhea, sore throat and trouble swallowing.   Eyes: Negative.   Respiratory: Negative.  Negative for cough, chest tightness, shortness of breath and wheezing.   Cardiovascular: Negative.  Negative for chest pain and palpitations.  Gastrointestinal: Negative.  Negative for abdominal pain, blood in stool, constipation, diarrhea, nausea and vomiting.  Endocrine: Negative.   Genitourinary: Negative.  Negative for difficulty urinating, dysuria, frequency, hematuria and urgency.  Musculoskeletal: Negative.  Negative for arthralgias, back pain, joint swelling, myalgias and neck pain.  Skin: Negative.  Negative for rash and wound.  Allergic/Immunologic: Negative.  Negative for immunocompromised state.  Neurological: Negative.  Negative for dizziness, seizures, numbness and headaches.  Hematological: Negative.   Psychiatric/Behavioral: Negative.  Negative for behavioral problems, self-injury and suicidal ideas. The patient is not nervous/anxious.     Vital Signs: BP 134/80 Comment: 144/82  Pulse 64   Temp 98.2 F (36.8 C)   Resp 16   Ht 5' 6 (1.676 m)   Wt 188 lb 9.6 oz (85.5 kg)   SpO2 99%   BMI 30.44 kg/m    Physical Exam Vitals reviewed.  Constitutional:      General: She is not in acute distress.    Appearance: Normal appearance. She is obese. She is not ill-appearing.  HENT:     Head: Normocephalic and atraumatic.     Right Ear: Tympanic membrane, ear canal and external ear normal.     Left Ear: Tympanic membrane, ear canal and external ear normal.     Nose: Nose normal. No congestion or rhinorrhea.     Mouth/Throat:     Mouth: Mucous membranes are moist.     Pharynx: Oropharynx is clear. No oropharyngeal exudate or posterior oropharyngeal erythema.  Eyes:     Extraocular  Movements: Extraocular movements intact.     Conjunctiva/sclera: Conjunctivae normal.     Pupils: Pupils are equal, round, and reactive to light.  Cardiovascular:     Rate and Rhythm: Normal rate  and regular rhythm.     Pulses: Normal pulses.     Heart sounds: Normal heart sounds. No murmur heard. Pulmonary:     Effort: Pulmonary effort is normal. No respiratory distress.     Breath sounds: Normal breath sounds. No wheezing.  Abdominal:     General: Bowel sounds are normal. There is no distension.     Palpations: Abdomen is soft.     Tenderness: There is no abdominal tenderness. There is no guarding.  Musculoskeletal:        General: Normal range of motion.     Cervical back: Normal range of motion and neck supple.     Right lower leg: No edema.     Left lower leg: No edema.  Lymphadenopathy:     Cervical: No cervical adenopathy.  Skin:    General: Skin is warm and dry.     Capillary Refill: Capillary refill takes less than 2 seconds.     Coloration: Skin is not jaundiced.     Findings: No bruising or lesion.  Neurological:     Mental Status: She is alert and oriented to person, place, and time.     Cranial Nerves: No cranial nerve deficit.     Coordination: Coordination normal.     Gait: Gait normal.  Psychiatric:        Mood and Affect: Mood normal.        Behavior: Behavior normal.        Thought Content: Thought content normal.        Judgment: Judgment normal.        Assessment/Plan: 1. Encounter for Medicare annual examination with abnormal findings (Primary) Age-appropriate preventive screenings and vaccinations discussed. Routine labs for health maintenance results discussed with patient. PHM updated.    2. Essential hypertension Stable, controlled with metoprolol  and amlodipine  as prescribed.   3. Mixed hyperlipidemia Continue simvastatin  as prescribed.   4. Vasomotor symptoms due to menopause Continue estradiol  as prescribed. - estradiol  (ESTRACE ) 0.5 MG tablet; Take 1 tablet (0.5 mg total) by mouth daily.  Dispense: 90 tablet; Refill: 1  5. Anxiety Continue alprazolam  as needed as prescribed.  - ALPRAZolam  (XANAX ) 0.5 MG tablet; Take 1 tablet  (0.5 mg total) by mouth at bedtime as needed for anxiety or sleep.  Dispense: 30 tablet; Refill: 1     General Counseling: arantxa piercey understanding of the findings of todays visit and agrees with plan of treatment. I have discussed any further diagnostic evaluation that may be needed or ordered today. We also reviewed her medications today. she has been encouraged to call the office with any questions or concerns that should arise related to todays visit.    No orders of the defined types were placed in this encounter.   Meds ordered this encounter  Medications   ALPRAZolam  (XANAX ) 0.5 MG tablet    Sig: Take 1 tablet (0.5 mg total) by mouth at bedtime as needed for anxiety or sleep.    Dispense:  30 tablet    Refill:  1    Refills, keep on file.   estradiol  (ESTRACE ) 0.5 MG tablet    Sig: Take 1 tablet (0.5 mg total) by mouth daily.    Dispense:  90 tablet    Refill:  1  For future refills.    Return in about 3 months (around 04/30/2024) for F/U, anxiety med refill, Amethyst Gainer PCP.   Total time spent:30 Minutes Time spent includes review of chart, medications, test results, and follow up plan with the patient.   [place] Controlled Substance Database was reviewed by me.  This patient was seen by Mardy Maxin, FNP-C in collaboration with Dr. Sigrid Bathe as a part of collaborative care agreement.  Romulus Hanrahan R. Maxin, MSN, FNP-C Internal medicine

## 2024-02-14 ENCOUNTER — Other Ambulatory Visit: Payer: Self-pay

## 2024-02-29 ENCOUNTER — Other Ambulatory Visit: Payer: Self-pay

## 2024-03-02 ENCOUNTER — Encounter: Payer: Self-pay | Admitting: Nurse Practitioner

## 2024-03-27 ENCOUNTER — Other Ambulatory Visit: Payer: Self-pay

## 2024-03-28 ENCOUNTER — Other Ambulatory Visit: Payer: Self-pay

## 2024-04-20 ENCOUNTER — Other Ambulatory Visit: Payer: Self-pay | Admitting: Nurse Practitioner

## 2024-04-20 DIAGNOSIS — N951 Menopausal and female climacteric states: Secondary | ICD-10-CM

## 2024-04-21 ENCOUNTER — Other Ambulatory Visit: Payer: Self-pay

## 2024-04-21 MED ORDER — MEDROXYPROGESTERONE ACETATE 5 MG PO TABS
5.0000 mg | ORAL_TABLET | Freq: Every day | ORAL | 1 refills | Status: AC
  Filled 2024-04-21: qty 90, 90d supply, fill #0

## 2024-04-24 ENCOUNTER — Other Ambulatory Visit: Payer: Self-pay | Admitting: Nurse Practitioner

## 2024-04-24 DIAGNOSIS — F419 Anxiety disorder, unspecified: Secondary | ICD-10-CM

## 2024-04-26 ENCOUNTER — Other Ambulatory Visit: Payer: Self-pay

## 2024-04-28 ENCOUNTER — Other Ambulatory Visit: Payer: Self-pay

## 2024-04-28 MED ORDER — ALPRAZOLAM 0.5 MG PO TABS
0.5000 mg | ORAL_TABLET | Freq: Every evening | ORAL | 0 refills | Status: DC | PRN
  Filled 2024-04-28: qty 30, 30d supply, fill #0

## 2024-05-01 ENCOUNTER — Other Ambulatory Visit: Payer: Self-pay

## 2024-05-01 ENCOUNTER — Ambulatory Visit: Admitting: Nurse Practitioner

## 2024-05-01 ENCOUNTER — Encounter: Payer: Self-pay | Admitting: Nurse Practitioner

## 2024-05-01 VITALS — BP 136/88 | HR 70 | Temp 98.2°F | Resp 16 | Ht 66.0 in | Wt 190.0 lb

## 2024-05-01 DIAGNOSIS — Z23 Encounter for immunization: Secondary | ICD-10-CM

## 2024-05-01 DIAGNOSIS — E782 Mixed hyperlipidemia: Secondary | ICD-10-CM | POA: Diagnosis not present

## 2024-05-01 DIAGNOSIS — F419 Anxiety disorder, unspecified: Secondary | ICD-10-CM | POA: Diagnosis not present

## 2024-05-01 DIAGNOSIS — I10 Essential (primary) hypertension: Secondary | ICD-10-CM | POA: Diagnosis not present

## 2024-05-01 MED ORDER — TETANUS-DIPHTH-ACELL PERTUSSIS 5-2-15.5 LF-MCG/0.5 IM SUSP
0.5000 mL | Freq: Once | INTRAMUSCULAR | 0 refills | Status: AC | PRN
  Filled 2024-05-01: qty 0.5, fill #0

## 2024-05-01 MED ORDER — ALPRAZOLAM 0.5 MG PO TABS
0.5000 mg | ORAL_TABLET | Freq: Every evening | ORAL | 2 refills | Status: AC | PRN
  Filled 2024-05-01: qty 30, 30d supply, fill #0

## 2024-05-01 NOTE — Progress Notes (Signed)
 [facility] [address] [place], [place] [address]  Internal MEDICINE  Office Visit Note  Patient Name: Alejandra Castro  [id number]  [id number]  Date of Service: 05/01/2024  Chief Complaint  Patient presents with   Hyperlipidemia   Hypertension   Follow-up    HPI Alejandra Castro presents for a follow-up visit for vaccinations, anxiety, hypertension, and high cholesterol.  Has a family member who is havign a baby soon and is high risk, the obgyn is requesting family members to get updated tdap booster for the pertussis immunity.  Due for mammogram  Due for pneumonia vaccine Anxiety -- due for refills of alprazolam , this medication has been effective in controlling her anxiety  Hypertension -- blood pressure is controlled with lisinopril , metoprolol  and amlodipine   High cholesterol -- taking simvastatin  daily.     Current Medication: Outpatient Encounter Medications as of 05/01/2024  Medication Sig   Tdap (ADACEL) 08-09-13.5 LF-MCG/0.5 injection Inject 0.5 mLs into the muscle once as needed for up to 1 dose (pertussis vaccination).   albuterol  (VENTOLIN  HFA) 108 (90 Base) MCG/ACT inhaler Inhale 2 puffs into the lungs every 4 (four) hours as needed for wheezing or shortness of breath.   [START ON 05/26/2024] ALPRAZolam  (XANAX ) 0.5 MG tablet Take 1 tablet (0.5 mg total) by mouth at bedtime as needed for anxiety or sleep.   amLODipine  (NORVASC ) 5 MG tablet Take 1 tablet (5 mg total) by mouth daily.   aspirin EC 81 MG tablet Take 81 mg by mouth daily.   Calcium Carb-Cholecalciferol 600-10 MG-MCG TABS Take by mouth.   Cholecalciferol 50 MCG (2000 UT) TABS Take by mouth. (Patient not taking: Reported on 05/01/2024)   estradiol  (ESTRACE ) 0.5 MG tablet Take 1 tablet (0.5 mg total) by mouth daily.   fluticasone  (FLONASE ) 50 MCG/ACT nasal spray Place 2 sprays into both nostrils daily.   lisinopril  (ZESTRIL ) 40 MG tablet Take 1 tablet (40 mg total) by mouth daily.   magnesium oxide  (MAG-OX) 400 MG tablet Take by mouth.   meclizine (ANTIVERT) 25 MG tablet Take by mouth. (Patient not taking: Reported on 05/01/2024)   medroxyPROGESTERone  (PROVERA ) 5 MG tablet Take 1 tablet (5 mg total) by mouth daily.   metoprolol  succinate (TOPROL -XL) 100 MG 24 hr tablet Take 1 tablet (100 mg total) by mouth daily.   Multiple Vitamin (MULTIVITAMIN WITH MINERALS) TABS tablet Take 1 tablet by mouth daily.   Omega-3 Fatty Acids (FISH OIL PO) Take 2 capsules by mouth daily.   pneumococcal 20-valent conjugate vaccine (PREVNAR 20 ) 0.5 ML injection Inject 0.5 mLs into the muscle once as needed for up to 1 dose for immunization.   simvastatin  (ZOCOR ) 20 MG tablet Take 1 tablet (20 mg total) by mouth daily at 6 PM.   [DISCONTINUED] ALPRAZolam  (XANAX ) 0.5 MG tablet Take 1 tablet (0.5 mg total) by mouth at bedtime as needed for anxiety or sleep.   [DISCONTINUED] felodipine (PLENDIL) 5 MG 24 hr tablet Take 5 mg by mouth at bedtime.    [DISCONTINUED] FLUoxetine (PROZAC) 20 MG capsule Take 20-40 mg by mouth every morning. USUALLY JUST TAKES 1 BUT WILL TAKE 2 IF NEEDED    No facility-administered encounter medications on file as of 05/01/2024.    Surgical History: Past Surgical History:  Procedure Laterality Date   COLONOSCOPY     COLONOSCOPY WITH PROPOFOL  N/A 02/16/2022   Procedure: COLONOSCOPY WITH PROPOFOL ;  Surgeon: Unk Corinn Skiff, MD;  Location: [facility] ENDOSCOPY;  Service: Gastroenterology;  Laterality: N/A;  Medical History: Past Medical History:  Diagnosis Date   Arthritis    Hyperlipidemia    Hypertension     Family History: Family History  Problem Relation Age of Onset   Breast cancer Neg Hx     Social History   Socioeconomic History   Marital status: Widowed    Spouse name: Not on file   Number of children: Not on file   Years of education: Not on file   Highest education level: Not on file  Occupational History   Not on file  Tobacco Use   Smoking status: Never    Smokeless tobacco: Never  Vaping Use   Vaping status: Never Used  Substance and Sexual Activity   Alcohol use: Yes    Alcohol/week: 14.0 standard drinks of alcohol    Types: 14 Cans of beer per week    Comment: rarely   Drug use: No   Sexual activity: Not Currently  Other Topics Concern   Not on file  Social History Narrative   Not on file   Social Drivers of Health   Tobacco Use: Low Risk (05/01/2024)   Patient History    Smoking Tobacco Use: Never    Smokeless Tobacco Use: Never    Passive Exposure: Not on file  Financial Resource Strain: Low Risk (10/28/2021)   Overall Financial Resource Strain (CARDIA)    Difficulty of Paying Living Expenses: Not hard at all  Food Insecurity: No Food Insecurity (10/28/2021)   Hunger Vital Sign    Worried About Running Out of Food in the Last Year: Never true    Ran Out of Food in the Last Year: Never true  Transportation Needs: No Transportation Needs (10/28/2021)   PRAPARE - Administrator, Civil Service (Medical): No    Lack of Transportation (Non-Medical): No  Physical Activity: Sufficiently Active (10/28/2021)   Exercise Vital Sign    Days of Exercise per Week: 4 days    Minutes of Exercise per Session: 40 min  Stress: No Stress Concern Present (10/28/2021)   [employer] of Occupational Health - Occupational Stress Questionnaire    Feeling of Stress : Not at all  Social Connections: Socially Isolated (10/28/2021)   Social Connection and Isolation Panel    Frequency of Communication with Friends and Family: More than three times a week    Frequency of Social Gatherings with Friends and Family: More than three times a week    Attends Religious Services: Never    Database Administrator or Organizations: No    Attends Banker Meetings: Never    Marital Status: Divorced  Catering Manager Violence: Not At Risk (10/28/2021)   Humiliation, Afraid, Rape, and Kick questionnaire    Fear of Current or Ex-Partner: No     Emotionally Abused: No    Physically Abused: No    Sexually Abused: No  Depression (PHQ2-9): Low Risk (02/07/2024)   Depression (PHQ2-9)    PHQ-2 Score: 0  Alcohol Screen: Low Risk (10/28/2021)   Alcohol Screen    Last Alcohol Screening Score (AUDIT): 0  Housing: Low Risk (10/28/2021)   Housing    Last Housing Risk Score: 0  Utilities: Not on file  Health Literacy: Not on file      Review of Systems  Constitutional:  Negative for chills, fatigue and unexpected weight change.  HENT:  Negative for congestion, rhinorrhea, sneezing and sore throat.   Respiratory: Negative.  Negative for cough, chest tightness, shortness of breath and  wheezing.   Cardiovascular: Negative.  Negative for chest pain and palpitations.  Gastrointestinal: Negative.  Negative for abdominal pain, constipation, diarrhea, nausea and vomiting.  Genitourinary:  Negative for dysuria and frequency.  Musculoskeletal:  Negative for arthralgias, back pain, joint swelling and neck pain.  Skin:  Negative for rash.  Neurological: Negative.   Psychiatric/Behavioral:  Positive for sleep disturbance. Negative for behavioral problems (Depression), self-injury and suicidal ideas. The patient is nervous/anxious.     Vital Signs: BP 136/88   Pulse 70   Temp 98.2 F (36.8 C)   Resp 16   Ht 5' 6 (1.676 m)   Wt 190 lb (86.2 kg)   SpO2 97%   BMI 30.67 kg/m    Physical Exam Vitals reviewed.  Constitutional:      General: She is not in acute distress.    Appearance: Normal appearance. She is obese. She is not ill-appearing.  HENT:     Head: Normocephalic and atraumatic.  Eyes:     Pupils: Pupils are equal, round, and reactive to light.  Cardiovascular:     Rate and Rhythm: Normal rate and regular rhythm.  Pulmonary:     Effort: Pulmonary effort is normal. No respiratory distress.  Neurological:     Mental Status: She is alert and oriented to person, place, and time.  Psychiatric:        Mood and Affect: Mood  normal.        Behavior: Behavior normal.        Assessment/Plan: 1. Essential hypertension (Primary) Stable, continue amlodipine , lisinopril  and metoprolol  as prescribed.   2. Mixed hyperlipidemia Continue simvastatin  as prescribed.   3. Need for immunization against pertussis Tdap ordered for pertussis vaccination - Tdap (ADACEL) 08-09-13.5 LF-MCG/0.5 injection; Inject 0.5 mLs into the muscle once as needed for up to 1 dose (pertussis vaccination).  Dispense: 0.5 mL; Refill: 0  4. Anxiety Continue alprazolam  as needed as prescribed. Follow up in 3 months for additional refills.  - ALPRAZolam  (XANAX ) 0.5 MG tablet; Take 1 tablet (0.5 mg total) by mouth at bedtime as needed for anxiety or sleep.  Dispense: 30 tablet; Refill: 2   General Counseling: shavonn convey understanding of the findings of todays visit and agrees with plan of treatment. I have discussed any further diagnostic evaluation that may be needed or ordered today. We also reviewed her medications today. she has been encouraged to call the office with any questions or concerns that should arise related to todays visit.    No orders of the defined types were placed in this encounter.   Meds ordered this encounter  Medications   Tdap (ADACEL) 08-09-13.5 LF-MCG/0.5 injection    Sig: Inject 0.5 mLs into the muscle once as needed for up to 1 dose (pertussis vaccination).    Dispense:  0.5 mL    Refill:  0    Needs booster for pertussis vaccination   ALPRAZolam  (XANAX ) 0.5 MG tablet    Sig: Take 1 tablet (0.5 mg total) by mouth at bedtime as needed for anxiety or sleep.    Dispense:  30 tablet    Refill:  2    Refills, keep on file.    Return in about 3 months (around 08/13/2024) for F/U, anxiety med refill, Kinnie Kaupp PCP.   Total time spent:30 Minutes Time spent includes review of chart, medications, test results, and follow up plan with the patient.   [place] Controlled Substance Database was reviewed by me.  This  patient was seen by  Mardy Maxin, FNP-C in collaboration with Dr. Sigrid Bathe as a part of collaborative care agreement.   Cele Mote R. Maxin, MSN, FNP-C Internal medicine

## 2024-05-02 ENCOUNTER — Encounter: Payer: Self-pay | Admitting: Nurse Practitioner

## 2024-05-13 ENCOUNTER — Other Ambulatory Visit: Payer: Self-pay | Admitting: Nurse Practitioner

## 2024-05-13 DIAGNOSIS — I10 Essential (primary) hypertension: Secondary | ICD-10-CM

## 2024-05-14 ENCOUNTER — Other Ambulatory Visit: Payer: Self-pay

## 2024-05-14 MED ORDER — AMLODIPINE BESYLATE 5 MG PO TABS
5.0000 mg | ORAL_TABLET | Freq: Every day | ORAL | 1 refills | Status: AC
  Filled 2024-05-14: qty 90, 90d supply, fill #0

## 2024-08-13 ENCOUNTER — Ambulatory Visit: Admitting: Nurse Practitioner

## 2025-02-09 ENCOUNTER — Ambulatory Visit: Admitting: Nurse Practitioner
